# Patient Record
Sex: Female | Born: 1980 | Race: White | Hispanic: No | Marital: Single | Smoking: Current every day smoker
Health system: Southern US, Community
[De-identification: ages and names within clinical notes are randomized; demographics above are authoritative.]

## PROBLEM LIST (undated history)

## (undated) DIAGNOSIS — N83202 Unspecified ovarian cyst, left side: Secondary | ICD-10-CM

## (undated) DIAGNOSIS — N83201 Unspecified ovarian cyst, right side: Secondary | ICD-10-CM

## (undated) HISTORY — PX: APPENDECTOMY: SHX54

## (undated) HISTORY — PX: OOPHORECTOMY: SHX86

## (undated) HISTORY — PX: OVARIAN CYST REMOVAL: SHX89

---

## 2000-06-24 ENCOUNTER — Emergency Department (HOSPITAL_COMMUNITY): Admission: EM | Admit: 2000-06-24 | Discharge: 2000-06-25 | Payer: Self-pay | Admitting: Emergency Medicine

## 2003-09-01 ENCOUNTER — Other Ambulatory Visit: Payer: Self-pay

## 2003-12-27 ENCOUNTER — Emergency Department: Payer: Self-pay | Admitting: Emergency Medicine

## 2004-03-19 ENCOUNTER — Emergency Department: Payer: Self-pay | Admitting: Emergency Medicine

## 2004-08-11 ENCOUNTER — Emergency Department: Payer: Self-pay | Admitting: Emergency Medicine

## 2004-11-05 ENCOUNTER — Emergency Department: Payer: Self-pay | Admitting: Emergency Medicine

## 2005-07-01 ENCOUNTER — Emergency Department: Payer: Self-pay | Admitting: Emergency Medicine

## 2005-08-25 ENCOUNTER — Emergency Department: Payer: Self-pay | Admitting: Emergency Medicine

## 2005-12-02 ENCOUNTER — Emergency Department: Payer: Self-pay

## 2006-03-04 ENCOUNTER — Emergency Department: Payer: Self-pay | Admitting: Emergency Medicine

## 2006-04-03 ENCOUNTER — Emergency Department: Payer: Self-pay | Admitting: Emergency Medicine

## 2006-07-07 ENCOUNTER — Emergency Department: Payer: Self-pay | Admitting: Emergency Medicine

## 2006-08-01 ENCOUNTER — Emergency Department: Payer: Self-pay | Admitting: Emergency Medicine

## 2008-06-28 ENCOUNTER — Emergency Department: Payer: Self-pay | Admitting: Internal Medicine

## 2008-08-11 ENCOUNTER — Emergency Department: Payer: Self-pay | Admitting: Emergency Medicine

## 2008-08-14 ENCOUNTER — Emergency Department: Payer: Self-pay | Admitting: Emergency Medicine

## 2008-10-10 ENCOUNTER — Emergency Department: Payer: Self-pay | Admitting: Emergency Medicine

## 2008-12-21 ENCOUNTER — Emergency Department: Payer: Self-pay | Admitting: Emergency Medicine

## 2009-04-19 ENCOUNTER — Emergency Department: Payer: Self-pay | Admitting: Unknown Physician Specialty

## 2009-06-05 ENCOUNTER — Emergency Department: Payer: Self-pay | Admitting: Emergency Medicine

## 2009-08-15 ENCOUNTER — Emergency Department: Payer: Self-pay | Admitting: Emergency Medicine

## 2009-08-23 ENCOUNTER — Emergency Department: Payer: Self-pay | Admitting: Emergency Medicine

## 2010-01-10 ENCOUNTER — Ambulatory Visit: Payer: Self-pay

## 2010-04-11 ENCOUNTER — Ambulatory Visit: Payer: Self-pay | Admitting: Family Medicine

## 2010-04-18 ENCOUNTER — Ambulatory Visit: Payer: Self-pay | Admitting: Family Medicine

## 2010-05-07 ENCOUNTER — Emergency Department: Payer: Self-pay | Admitting: Internal Medicine

## 2013-01-19 ENCOUNTER — Emergency Department: Payer: Self-pay | Admitting: Emergency Medicine

## 2013-01-19 LAB — URINALYSIS, COMPLETE
Bacteria: NONE SEEN
Bilirubin,UR: NEGATIVE
Blood: NEGATIVE
Glucose,UR: NEGATIVE mg/dL (ref 0–75)
Ketone: NEGATIVE
Nitrite: NEGATIVE
Protein: NEGATIVE
RBC,UR: <1 /HPF (ref 0–5)
Specific Gravity: 1.012 (ref 1.003–1.030)

## 2013-01-19 LAB — COMPREHENSIVE METABOLIC PANEL
Albumin: 3.5 g/dL (ref 3.4–5.0)
Anion Gap: 4 — ABNORMAL LOW (ref 7–16)
BUN: 9 mg/dL (ref 7–18)
Calcium, Total: 8.7 mg/dL (ref 8.5–10.1)
Chloride: 104 mmol/L (ref 98–107)
Creatinine: 0.8 mg/dL (ref 0.60–1.30)
EGFR (African American): >60
SGOT(AST): 15 U/L (ref 15–37)
Total Protein: 6.4 g/dL (ref 6.4–8.2)

## 2013-01-19 LAB — CBC WITH DIFFERENTIAL/PLATELET
Eosinophil #: 0.4 10*3/uL (ref 0.0–0.7)
Eosinophil %: 4.2 %
HCT: 35.9 % (ref 35.0–47.0)
HGB: 12.2 g/dL (ref 12.0–16.0)
Monocyte #: 0.5 x10 3/mm (ref 0.2–0.9)
Neutrophil %: 52.9 %
Platelet: 239 10*3/uL (ref 150–440)
RBC: 3.94 10*6/uL (ref 3.80–5.20)
WBC: 10.1 10*3/uL (ref 3.6–11.0)

## 2013-04-16 ENCOUNTER — Emergency Department: Payer: Self-pay | Admitting: Emergency Medicine

## 2013-04-16 LAB — URINALYSIS, COMPLETE
BACTERIA: NONE SEEN
Bilirubin,UR: NEGATIVE
Blood: NEGATIVE
GLUCOSE, UR: NEGATIVE mg/dL (ref 0–75)
KETONE: NEGATIVE
LEUKOCYTE ESTERASE: NEGATIVE
Nitrite: NEGATIVE
PH: 6 (ref 4.5–8.0)
Protein: NEGATIVE
SPECIFIC GRAVITY: 1.017 (ref 1.003–1.030)

## 2013-05-10 ENCOUNTER — Emergency Department: Payer: Self-pay | Admitting: Emergency Medicine

## 2013-06-15 ENCOUNTER — Emergency Department: Payer: Self-pay | Admitting: Emergency Medicine

## 2013-06-15 LAB — CBC WITH DIFFERENTIAL/PLATELET
BASOS ABS: 0.1 10*3/uL (ref 0.0–0.1)
Basophil %: 1.3 %
EOS PCT: 1.6 %
Eosinophil #: 0.2 10*3/uL (ref 0.0–0.7)
HCT: 38 % (ref 35.0–47.0)
HGB: 12.5 g/dL (ref 12.0–16.0)
Lymphocyte #: 3.2 10*3/uL (ref 1.0–3.6)
Lymphocyte %: 30.3 %
MCH: 30.9 pg (ref 26.0–34.0)
MCHC: 33 g/dL (ref 32.0–36.0)
MCV: 94 fL (ref 80–100)
MONOS PCT: 6.1 %
Monocyte #: 0.6 x10 3/mm (ref 0.2–0.9)
NEUTROS PCT: 60.7 %
Neutrophil #: 6.4 10*3/uL (ref 1.4–6.5)
PLATELETS: 269 10*3/uL (ref 150–440)
RBC: 4.05 10*6/uL (ref 3.80–5.20)
RDW: 13.5 % (ref 11.5–14.5)
WBC: 10.5 10*3/uL (ref 3.6–11.0)

## 2013-06-15 LAB — URINALYSIS, COMPLETE
BILIRUBIN, UR: NEGATIVE
Bacteria: NONE SEEN
Blood: NEGATIVE
Glucose,UR: NEGATIVE mg/dL (ref 0–75)
KETONE: NEGATIVE
Leukocyte Esterase: NEGATIVE
NITRITE: NEGATIVE
Ph: 7 (ref 4.5–8.0)
Protein: NEGATIVE
RBC,UR: <1 /HPF (ref 0–5)
Specific Gravity: 1.013 (ref 1.003–1.030)
Squamous Epithelial: <1
WBC UR: NONE SEEN /HPF (ref 0–5)

## 2013-06-15 LAB — BASIC METABOLIC PANEL
ANION GAP: 3 — AB (ref 7–16)
BUN: 16 mg/dL (ref 7–18)
CO2: 29 mmol/L (ref 21–32)
Calcium, Total: 8.8 mg/dL (ref 8.5–10.1)
Chloride: 104 mmol/L (ref 98–107)
Creatinine: 0.78 mg/dL (ref 0.60–1.30)
EGFR (Non-African Amer.): >60
GLUCOSE: 89 mg/dL (ref 65–99)
Osmolality: 273 (ref 275–301)
Potassium: 4.3 mmol/L (ref 3.5–5.1)
SODIUM: 136 mmol/L (ref 136–145)

## 2013-07-07 ENCOUNTER — Emergency Department: Payer: Self-pay | Admitting: Emergency Medicine

## 2013-08-31 ENCOUNTER — Emergency Department: Payer: Self-pay | Admitting: Emergency Medicine

## 2013-08-31 LAB — URINALYSIS, COMPLETE
Bilirubin,UR: NEGATIVE
Glucose,UR: NEGATIVE mg/dL (ref 0–75)
Ketone: NEGATIVE
NITRITE: POSITIVE
PH: 5 (ref 4.5–8.0)
RBC,UR: 23 /HPF (ref 0–5)
Specific Gravity: 1.016 (ref 1.003–1.030)
Squamous Epithelial: 4
WBC UR: 1002 /HPF (ref 0–5)

## 2013-10-13 ENCOUNTER — Emergency Department: Payer: Self-pay | Admitting: Emergency Medicine

## 2014-03-04 ENCOUNTER — Emergency Department: Payer: Self-pay | Admitting: Emergency Medicine

## 2014-03-04 LAB — CBC WITH DIFFERENTIAL/PLATELET
BASOS PCT: 1.6 %
Basophil #: 0.1 10*3/uL (ref 0.0–0.1)
EOS PCT: 11.2 %
Eosinophil #: 1 10*3/uL — ABNORMAL HIGH (ref 0.0–0.7)
HCT: 41.7 % (ref 35.0–47.0)
HGB: 14 g/dL (ref 12.0–16.0)
LYMPHS ABS: 3.1 10*3/uL (ref 1.0–3.6)
Lymphocyte %: 34.8 %
MCH: 31.4 pg (ref 26.0–34.0)
MCHC: 33.5 g/dL (ref 32.0–36.0)
MCV: 94 fL (ref 80–100)
MONOS PCT: 6.4 %
Monocyte #: 0.6 x10 3/mm (ref 0.2–0.9)
NEUTROS ABS: 4.1 10*3/uL (ref 1.4–6.5)
Neutrophil %: 46 %
Platelet: 232 10*3/uL (ref 150–440)
RBC: 4.46 10*6/uL (ref 3.80–5.20)
RDW: 12.5 % (ref 11.5–14.5)
WBC: 9 10*3/uL (ref 3.6–11.0)

## 2014-03-04 LAB — COMPREHENSIVE METABOLIC PANEL
ALT: 12 U/L — AB
Albumin: 3.4 g/dL (ref 3.4–5.0)
Alkaline Phosphatase: 39 U/L — ABNORMAL LOW
Anion Gap: 5 — ABNORMAL LOW (ref 7–16)
BILIRUBIN TOTAL: 0.3 mg/dL (ref 0.2–1.0)
BUN: 12 mg/dL (ref 7–18)
CREATININE: 0.88 mg/dL (ref 0.60–1.30)
Calcium, Total: 8.7 mg/dL (ref 8.5–10.1)
Chloride: 109 mmol/L — ABNORMAL HIGH (ref 98–107)
Co2: 25 mmol/L (ref 21–32)
EGFR (African American): >60
EGFR (Non-African Amer.): >60
Glucose: 107 mg/dL — ABNORMAL HIGH (ref 65–99)
OSMOLALITY: 278 (ref 275–301)
Potassium: 4.4 mmol/L (ref 3.5–5.1)
SGOT(AST): 21 U/L (ref 15–37)
Sodium: 139 mmol/L (ref 136–145)
Total Protein: 6.7 g/dL (ref 6.4–8.2)

## 2014-03-04 LAB — URINALYSIS, COMPLETE
BACTERIA: NONE SEEN
BILIRUBIN, UR: NEGATIVE
BLOOD: NEGATIVE
Glucose,UR: NEGATIVE mg/dL (ref 0–75)
Ketone: NEGATIVE
Leukocyte Esterase: NEGATIVE
NITRITE: NEGATIVE
Ph: 6 (ref 4.5–8.0)
Protein: NEGATIVE
RBC,UR: NONE SEEN /HPF (ref 0–5)
Specific Gravity: 1.015 (ref 1.003–1.030)
Squamous Epithelial: 4
WBC UR: <1 /HPF (ref 0–5)

## 2014-06-13 ENCOUNTER — Emergency Department
Admit: 2014-06-13 | Disposition: A | Discharge status: Home / Self Care | Payer: Self-pay | Admitting: Emergency Medicine

## 2014-06-13 LAB — BASIC METABOLIC PANEL
Anion Gap: 5 — ABNORMAL LOW (ref 7–16)
BUN: 16 mg/dL
CHLORIDE: 105 mmol/L
CO2: 29 mmol/L
CREATININE: 0.82 mg/dL
Calcium, Total: 8.9 mg/dL
EGFR (African American): >60
EGFR (Non-African Amer.): >60
Glucose: 79 mg/dL
Potassium: 3.9 mmol/L
SODIUM: 139 mmol/L

## 2014-06-13 LAB — CBC WITH DIFFERENTIAL/PLATELET
Basophil #: 0.1 10*3/uL (ref 0.0–0.1)
Basophil %: 1 %
Eosinophil #: 0.4 10*3/uL (ref 0.0–0.7)
Eosinophil %: 4.3 %
HCT: 39 % (ref 35.0–47.0)
HGB: 13 g/dL (ref 12.0–16.0)
Lymphocyte #: 3.8 10*3/uL — ABNORMAL HIGH (ref 1.0–3.6)
Lymphocyte %: 40.1 %
MCH: 30.9 pg (ref 26.0–34.0)
MCHC: 33.3 g/dL (ref 32.0–36.0)
MCV: 93 fL (ref 80–100)
MONOS PCT: 6.4 %
Monocyte #: 0.6 x10 3/mm (ref 0.2–0.9)
Neutrophil #: 4.5 10*3/uL (ref 1.4–6.5)
Neutrophil %: 48.2 %
PLATELETS: 258 10*3/uL (ref 150–440)
RBC: 4.2 10*6/uL (ref 3.80–5.20)
RDW: 13.3 % (ref 11.5–14.5)
WBC: 9.4 10*3/uL (ref 3.6–11.0)

## 2014-06-13 LAB — URINALYSIS, COMPLETE
BACTERIA: NONE SEEN
BILIRUBIN, UR: NEGATIVE
Blood: NEGATIVE
Glucose,UR: NEGATIVE mg/dL (ref 0–75)
KETONE: NEGATIVE
Leukocyte Esterase: NEGATIVE
Nitrite: NEGATIVE
PROTEIN: NEGATIVE
Ph: 5 (ref 4.5–8.0)
Specific Gravity: 1.017 (ref 1.003–1.030)

## 2014-07-22 ENCOUNTER — Encounter: Payer: Self-pay | Admitting: Emergency Medicine

## 2014-07-22 ENCOUNTER — Emergency Department: Payer: Self-pay

## 2014-07-22 ENCOUNTER — Emergency Department
Admission: EM | Admit: 2014-07-22 | Discharge: 2014-07-22 | Disposition: A | Discharge status: Home / Self Care | Payer: Self-pay | Attending: Emergency Medicine | Admitting: Emergency Medicine

## 2014-07-22 DIAGNOSIS — Z720 Tobacco use: Secondary | ICD-10-CM | POA: Insufficient documentation

## 2014-07-22 DIAGNOSIS — Y9289 Other specified places as the place of occurrence of the external cause: Secondary | ICD-10-CM | POA: Insufficient documentation

## 2014-07-22 DIAGNOSIS — Y998 Other external cause status: Secondary | ICD-10-CM | POA: Insufficient documentation

## 2014-07-22 DIAGNOSIS — X58XXXA Exposure to other specified factors, initial encounter: Secondary | ICD-10-CM | POA: Insufficient documentation

## 2014-07-22 DIAGNOSIS — S39012A Strain of muscle, fascia and tendon of lower back, initial encounter: Secondary | ICD-10-CM | POA: Insufficient documentation

## 2014-07-22 DIAGNOSIS — Y9389 Activity, other specified: Secondary | ICD-10-CM | POA: Insufficient documentation

## 2014-07-22 DIAGNOSIS — Z79899 Other long term (current) drug therapy: Secondary | ICD-10-CM | POA: Insufficient documentation

## 2014-07-22 MED ORDER — DIAZEPAM 2 MG PO TABS
ORAL_TABLET | ORAL | Status: AC
  Administered 2014-07-22: 2 mg via ORAL
  Filled 2014-07-22: qty 1

## 2014-07-22 MED ORDER — OXYCODONE-ACETAMINOPHEN 5-325 MG PO TABS: 1.0000 | ORAL_TABLET | ORAL | Status: DC | PRN

## 2014-07-22 MED ORDER — OXYCODONE-ACETAMINOPHEN 5-325 MG PO TABS
ORAL_TABLET | ORAL | Status: AC
  Filled 2014-07-22: qty 1

## 2014-07-22 MED ORDER — KETOROLAC TROMETHAMINE 60 MG/2ML IM SOLN
60.0000 mg | Freq: Once | INTRAMUSCULAR | Status: AC
  Administered 2014-07-22: 60 mg via INTRAMUSCULAR

## 2014-07-22 MED ORDER — ETODOLAC 400 MG PO TABS: 400.0000 mg | ORAL_TABLET | Freq: Two times a day (BID) | ORAL | Status: DC

## 2014-07-22 MED ORDER — DIAZEPAM 2 MG PO TABS: 2.0000 mg | ORAL_TABLET | Freq: Three times a day (TID) | ORAL | Status: DC | PRN

## 2014-07-22 MED ORDER — KETOROLAC TROMETHAMINE 60 MG/2ML IM SOLN
INTRAMUSCULAR | Status: AC
  Administered 2014-07-22: 60 mg via INTRAMUSCULAR
  Filled 2014-07-22: qty 2

## 2014-07-22 MED ORDER — DIAZEPAM 2 MG PO TABS
2.0000 mg | ORAL_TABLET | Freq: Once | ORAL | Status: AC
  Administered 2014-07-22: 2 mg via ORAL

## 2014-07-22 MED ORDER — OXYCODONE-ACETAMINOPHEN 5-325 MG PO TABS
1.0000 | ORAL_TABLET | Freq: Once | ORAL | Status: AC
  Administered 2014-07-22: 1 via ORAL

## 2014-07-22 NOTE — ED Provider Notes (Signed)
[facility] Emergency Department Provider Note  ____________________________________________  Time seen: Approximately 9:30 AM  I have reviewed the triage vital signs and the nursing notes.   HISTORY  Chief Complaint Back Pain  HPI Jennifer Dennis is a 34 y.o. female comes in today with complaint of low back pain.She states that approximately 2 weeks ago she was lifting some sheet rock and has had pain since. She is tried over-the-counter medications without any relief. She denies any loss of bowel or bladder function. Currently her pain is rated 7 out of 10. There is no radiation of pain down into the legs, she is able to ambulate slowly. She denies any urinary symptoms. There is no recent back injury or surgeries prior to this.   History reviewed. No pertinent past medical history.  There are no active problems to display for this patient.   History reviewed. No pertinent past surgical history.  Current Outpatient Rx  Name  Route  Sig  Dispense  Refill  . diazepam (VALIUM) 2 MG tablet   Oral   Take 1 tablet (2 mg total) by mouth every 8 (eight) hours as needed for muscle spasms.   9 tablet   0   . etodolac (LODINE) 400 MG tablet   Oral   Take 1 tablet (400 mg total) by mouth 2 (two) times daily.   20 tablet   0   . oxyCODONE-acetaminophen (PERCOCET) 5-325 MG per tablet   Oral   Take 1 tablet by mouth every 4 (four) hours as needed for severe pain.   20 tablet   0     Allergies Sulfur  No family history on file.  Social History History  Substance Use Topics  . Smoking status: Current Every Day Smoker  . Smokeless tobacco: Not on file  . Alcohol Use: No    Review of Systems Constitutional: No fever/chills Eyes: No visual changes. ENT: No sore throat. Cardiovascular: Denies chest pain. Respiratory: Denies shortness of breath. Gastrointestinal: No abdominal pain.  No nausea, no vomiting.  Genitourinary: Negative for  dysuria. Musculoskeletal: Positive for back pain. Skin: Negative for rash. Neurological: Negative for headaches, focal weakness or numbness.  10-point ROS otherwise negative.  ____________________________________________   PHYSICAL EXAM:  VITAL SIGNS: ED Triage Vitals  Enc Vitals Group     BP 07/22/14 0911 99/69 mmHg     Pulse Rate 07/22/14 0911 71     Resp --      Temp 07/22/14 0911 98.2 F (36.8 C)     Temp Source 07/22/14 0911 Oral     SpO2 07/22/14 0911 99 %     Weight 07/22/14 0911 120 lb (54.432 kg)     Height 07/22/14 0911 5' (1.524 m)     Head Cir --      Peak Flow --      Pain Score 07/22/14 0909 7     Pain Loc --      Pain Edu? --      Excl. in GC? --     Constitutional: Alert and oriented. Well appearing and in no acute distress. Eyes: Conjunctivae are normal. PERRL. EOMI. Head: Atraumatic. Nose: No congestion/rhinnorhea. Mouth/Throat: Mucous membranes are moist. Neck: No stridor.   Cardiovascular: Normal rate, regular rhythm. Grossly normal heart sounds.  Good peripheral circulation. Respiratory: Normal respiratory effort.  No retractions. Lungs CTAB. Gastrointestinal: Soft and nontender. No distention. No abdominal bruits. No CVA tenderness. Musculoskeletal: No lower extremity tenderness nor edema.  No joint effusions.  There is moderate tenderness on palpation of low back L5-S1 area and paravertebral muscles. Range of motion is restricted secondary to discomfort. Straight leg raise is approximately 90 with discomfort. No gross deformity was noted. Neurologic:  Normal speech and language. No gross focal neurologic deficits are appreciated. Speech is normal. No gait instability. Reflexes are 2+ bilaterally. Skin:  Skin is warm, dry and intact. No rash noted. Psychiatric: Mood and affect are normal. Speech and behavior are normal.  ____________________________________________   LABS (all labs ordered are listed, but only abnormal results are  displayed)  Labs Reviewed - No data to display RADIOLOGY  Mild dextroscoliosis was noted on x-ray with minimal disc space flattening at L5-S1 per radiologist and reviewed by me. ____________________________________________   PROCEDURES  Procedure(s) performed: None  Critical Care performed: No  ____________________________________________   INITIAL IMPRESSION / ASSESSMENT AND PLAN / ED COURSE  Pertinent labs & imaging results that were available during my care of the patient were reviewed by me and considered in my medical decision making (see chart for details).  Patient is to follow-up with orthopedist if not improving. She was placed on anti-inflammatory, pain medication and a muscle relaxant. She states she does not work this weekend. She is to return to the emergency room today severe worsening or urgent concerns. ____________________________________________   FINAL CLINICAL IMPRESSION(S) / ED DIAGNOSES  Final diagnoses:  Low back strain, initial encounter      Tommi RumpsRhonda L Asahd Can, PA-C 07/22/14 1119  Governor Rooksebecca Lord, MD 07/22/14 725 [phone]

## 2014-07-22 NOTE — ED Notes (Signed)
Back from xray  States no relief with pain meds   PA aware

## 2015-01-09 ENCOUNTER — Encounter: Payer: Self-pay | Admitting: Emergency Medicine

## 2015-01-09 ENCOUNTER — Emergency Department
Admission: EM | Admit: 2015-01-09 | Discharge: 2015-01-09 | Discharge status: Against Medical Advice | Payer: Self-pay | Attending: Emergency Medicine | Admitting: Emergency Medicine

## 2015-01-09 DIAGNOSIS — Y93F2 Activity, caregiving, lifting: Secondary | ICD-10-CM | POA: Insufficient documentation

## 2015-01-09 DIAGNOSIS — Z720 Tobacco use: Secondary | ICD-10-CM | POA: Insufficient documentation

## 2015-01-09 DIAGNOSIS — Y9289 Other specified places as the place of occurrence of the external cause: Secondary | ICD-10-CM | POA: Insufficient documentation

## 2015-01-09 DIAGNOSIS — S3992XA Unspecified injury of lower back, initial encounter: Secondary | ICD-10-CM | POA: Insufficient documentation

## 2015-01-09 DIAGNOSIS — X501XXA Overexertion from prolonged static or awkward postures, initial encounter: Secondary | ICD-10-CM | POA: Insufficient documentation

## 2015-01-09 DIAGNOSIS — Y990 Civilian activity done for income or pay: Secondary | ICD-10-CM | POA: Insufficient documentation

## 2015-01-09 NOTE — ED Notes (Signed)
Pt to ed with c/o lifting something heavy at work today and since then having lower back pain.  Pt reports this is NOT workers comp.

## 2015-01-09 NOTE — ED Notes (Signed)
Pt not in room for PA exam

## 2015-01-09 NOTE — ED Notes (Signed)
Pt not in room for exam

## 2015-03-01 ENCOUNTER — Emergency Department
Admission: EM | Admit: 2015-03-01 | Discharge: 2015-03-01 | Disposition: A | Discharge status: Home / Self Care | Payer: Self-pay | Attending: Emergency Medicine | Admitting: Emergency Medicine

## 2015-03-01 DIAGNOSIS — Z3202 Encounter for pregnancy test, result negative: Secondary | ICD-10-CM | POA: Insufficient documentation

## 2015-03-01 DIAGNOSIS — Z79899 Other long term (current) drug therapy: Secondary | ICD-10-CM | POA: Insufficient documentation

## 2015-03-01 DIAGNOSIS — R1031 Right lower quadrant pain: Secondary | ICD-10-CM | POA: Insufficient documentation

## 2015-03-01 DIAGNOSIS — F17200 Nicotine dependence, unspecified, uncomplicated: Secondary | ICD-10-CM | POA: Insufficient documentation

## 2015-03-01 DIAGNOSIS — N83201 Unspecified ovarian cyst, right side: Secondary | ICD-10-CM | POA: Insufficient documentation

## 2015-03-01 LAB — URINALYSIS COMPLETE WITH MICROSCOPIC (ARMC ONLY)
BILIRUBIN URINE: NEGATIVE
Bacteria, UA: NONE SEEN
GLUCOSE, UA: NEGATIVE mg/dL
Ketones, ur: NEGATIVE mg/dL
LEUKOCYTES UA: NEGATIVE
Nitrite: NEGATIVE
Protein, ur: NEGATIVE mg/dL
Specific Gravity, Urine: 1.021 (ref 1.005–1.030)
pH: 6 (ref 5.0–8.0)

## 2015-03-01 LAB — COMPREHENSIVE METABOLIC PANEL
ALT: 9 U/L — ABNORMAL LOW (ref 14–54)
AST: 15 U/L (ref 15–41)
Albumin: 4.1 g/dL (ref 3.5–5.0)
Alkaline Phosphatase: 31 U/L — ABNORMAL LOW (ref 38–126)
Anion gap: 8 (ref 5–15)
BUN: 14 mg/dL (ref 6–20)
CALCIUM: 9.2 mg/dL (ref 8.9–10.3)
CO2: 22 mmol/L (ref 22–32)
CREATININE: 0.76 mg/dL (ref 0.44–1.00)
Chloride: 107 mmol/L (ref 101–111)
Glucose, Bld: 121 mg/dL — ABNORMAL HIGH (ref 65–99)
Potassium: 4.1 mmol/L (ref 3.5–5.1)
Sodium: 137 mmol/L (ref 135–145)
Total Bilirubin: 0.9 mg/dL (ref 0.3–1.2)
Total Protein: 7.1 g/dL (ref 6.5–8.1)

## 2015-03-01 LAB — CBC
HCT: 39 % (ref 35.0–47.0)
Hemoglobin: 13.2 g/dL (ref 12.0–16.0)
MCH: 30.3 pg (ref 26.0–34.0)
MCHC: 33.8 g/dL (ref 32.0–36.0)
MCV: 89.7 fL (ref 80.0–100.0)
PLATELETS: 263 10*3/uL (ref 150–440)
RBC: 4.35 MIL/uL (ref 3.80–5.20)
RDW: 12.8 % (ref 11.5–14.5)
WBC: 8 10*3/uL (ref 3.6–11.0)

## 2015-03-01 LAB — LIPASE, BLOOD: Lipase: 39 U/L (ref 11–51)

## 2015-03-01 LAB — POCT PREGNANCY, URINE: PREG TEST UR: NEGATIVE

## 2015-03-01 MED ORDER — HYDROCODONE-ACETAMINOPHEN 5-325 MG PO TABS: 1.0000 | ORAL_TABLET | Freq: Four times a day (QID) | ORAL | Status: DC | PRN

## 2015-03-01 NOTE — ED Notes (Signed)
Pt discharged home after verbalizing understanding of discharge instructions; nad noted. 

## 2015-03-01 NOTE — ED Provider Notes (Signed)
Time Seen: Approximately 1420  I have reviewed the triage notes  Chief Complaint: Abdominal Pain   History of Present Illness: Jennifer Dennis is a 34 y.o. female who states some right lower quadrant abdominal pain. 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 Patient states that she has a follow-up appointment scheduled but is not available until the end of January. She's had pain down the right lower quadrant for the last 2 days without any associated fever she states the pain seems to come and go home and said denies any further vaginal discharge etc. Pain intensity, characteristics, location is similar to her previous ovarian cysts   History reviewed. No pertinent past medical history.  There are no active problems to display for this patient.   History reviewed. No pertinent past surgical history.  History reviewed. No pertinent past surgical history.  Current Outpatient Rx  Name  Route  Sig  Dispense  Refill  . diazepam (VALIUM) 2 MG tablet   Oral   Take 1 tablet (2 mg total) by mouth every 8 (eight) hours as needed for muscle spasms.   9 tablet   0   . etodolac (LODINE) 400 MG tablet   Oral   Take 1 tablet (400 mg total) by mouth 2 (two) times daily.   20 tablet   0   . HYDROcodone-acetaminophen (NORCO) 5-325 MG tablet   Oral   Take 1 tablet by mouth every 6 (six) hours as needed for moderate pain.   20 tablet   0   . oxyCODONE-acetaminophen (PERCOCET) 5-325 MG per tablet   Oral   Take 1 tablet by mouth every 4 (four) hours as needed for severe pain.   20 tablet   0     Allergies:  Sulfur  Family History: No family history on file.  Social History: Social History  Substance Use Topics  . Smoking status: Current Every Day Smoker  . Smokeless  tobacco: None  . Alcohol Use: Yes     Review of Systems:   10 point review of systems was performed and was otherwise negative:  Constitutional: No fever Eyes: No visual disturbances ENT: No sore throat, ear pain Cardiac: No chest pain Respiratory: No shortness of breath, wheezing, or stridor Abdomen: Exclusively right lower quadrant, no vomiting, No diarrhea Endocrine: No weight loss, No night sweats Extremities: No peripheral edema, cyanosis Skin: No rashes, easy bruising Neurologic: No focal weakness, trouble with speech or swollowing Urologic: No dysuria, Hematuria, or urinary frequency   Physical Exam:  ED Triage Vitals  Enc Vitals Group     BP 03/01/15 1103 134/111 mmHg     Pulse Rate 03/01/15 1103 96     Resp 03/01/15 1103 18     Temp 03/01/15 1103 98.1 F (36.7 C)     Temp Source 03/01/15 1103 Oral     SpO2 03/01/15 1103 98 %     Weight 03/01/15 1103 125 lb (56.7 kg)     Height 03/01/15 1103 5' (1.524 m)     Head Cir --      Peak Flow --      Pain Score 03/01/15 1103 9     Pain Loc --      Pain Edu? --      Excl. in GC? --  General: Awake , Alert , and Oriented times 3; GCS 15 Head: Normal cephalic , atraumatic Eyes: Pupils equal , round, reactive to light Nose/Throat: No nasal drainage, patent upper airway without erythema or exudate.  Neck: Supple, Full range of motion, No anterior adenopathy or palpable thyroid masses Lungs: Clear to ascultation without wheezes , rhonchi, or rales Heart: Regular rate, regular rhythm without murmurs , gallops , or rubs Abdomen: Mild tenderness in right lower quadrant without palpable masses and without rebound, guarding , or rigidity; bowel sounds positive and symmetric in all 4 quadrants. No organomegaly .        Extremities: 2 plus symmetric pulses. No edema, clubbing or cyanosis Neurologic: normal ambulation, Motor symmetric without deficits, sensory intact Skin: warm, dry, no rashes   Labs:   All laboratory  work was reviewed including any pertinent negatives or positives listed below:  Labs Reviewed  COMPREHENSIVE METABOLIC PANEL - Abnormal; Notable for the following:    Glucose, Bld 121 (*)    ALT 9 (*)    Alkaline Phosphatase 31 (*)    All other components within normal limits  URINALYSIS COMPLETEWITH MICROSCOPIC ([facility] ONLY) - Abnormal; Notable for the following:    Color, Urine YELLOW (*)    APPearance CLEAR (*)    Hgb urine dipstick 1+ (*)    Squamous Epithelial / LPF 0-5 (*)    All other components within normal limits  LIPASE, BLOOD  CBC  POC URINE PREG, ED  POCT PREGNANCY, URINE     ED Course: * Differential was limited based on the patient's previous past medical and surgical history. I felt this was unlikely to be ovarian torsion and felt further imaging was not necessary. Patient most likely has a recurrent ovarian cyst. She has been taking ibuprofen over-the-counter for pain she was advised continue with that medication was prescribed Norco for breakthrough pain. Follow-up appointment as scheduled with Dr. Crista Curb OB/GYN.    Assessment:  Right-sided ovarian cyst   Final Clinical Impression:   Final diagnoses:  Right lower quadrant abdominal pain     Plan:  Outpatient management Patient was advised to return immediately if condition worsens. Patient was advised to follow up with their primary care physician or other specialized physicians involved in their outpatient care            Jennye Moccasin, MD 03/01/15 1454

## 2015-03-01 NOTE — ED Notes (Signed)
Pt arrives to ER via POV c/o RLQ pain. Pt states that she has a cyst on her ovary to right side. Pt thinks that it may have burst. Pain X 2 nights, causing pt to vomit due to pain. Pt alert and oriented X4, active, cooperative, pt in NAD. RR even and unlabored, color WNL.

## 2015-03-01 NOTE — Discharge Instructions (Signed)
Abdominal Pain, Adult °Many things can cause abdominal pain. Usually, abdominal pain is not caused by a disease and will improve without treatment. It can often be observed and treated at home. Your health care provider will do a physical exam and possibly order blood tests and X-rays to help determine the seriousness of your pain. However, in many cases, more time must pass before a clear cause of the pain can be found. Before that point, your health care provider may not know if you need more testing or further treatment. °HOME CARE INSTRUCTIONS °Monitor your abdominal pain for any changes. The following actions may help to alleviate any discomfort you are experiencing: °· Only take over-the-counter or prescription medicines as directed by your health care provider. °· Do not take laxatives unless directed to do so by your health care provider. °· Try a clear liquid diet (broth, tea, or water) as directed by your health care provider. Slowly move to a bland diet as tolerated. °SEEK MEDICAL CARE IF: °· You have unexplained abdominal pain. °· You have abdominal pain associated with nausea or diarrhea. °· You have pain when you urinate or have a bowel movement. °· You experience abdominal pain that wakes you in the night. °· You have abdominal pain that is worsened or improved by eating food. °· You have abdominal pain that is worsened with eating fatty foods. °· You have a fever. °SEEK IMMEDIATE MEDICAL CARE IF: °· Your pain does not go away within 2 hours. °· You keep throwing up (vomiting). °· Your pain is felt only in portions of the abdomen, such as the right side or the left lower portion of the abdomen. °· You pass bloody or black tarry stools. °MAKE SURE YOU: °· Understand these instructions. °· Will watch your condition. °· Will get help right away if you are not doing well or get worse. °  °This information is not intended to replace advice given to you by your health care provider. Make sure you discuss  any questions you have with your health care provider. °  °Document Released: 11/28/2004 Document Revised: 11/09/2014 Document Reviewed: 10/28/2012 °Elsevier Interactive Patient Education ©2016 [employer] ° ° °Please return immediately if condition worsens. Please contact her primary physician or the physician you were given for referral. If you have any specialist physicians involved in her treatment and plan please also contact them. Thank you for using [facility]. ° °

## 2015-03-01 NOTE — ED Notes (Signed)
Pt from home with possible cyst rupture. Pt reports RLQ pain x 2 days.

## 2015-04-27 ENCOUNTER — Emergency Department
Admission: EM | Admit: 2015-04-27 | Discharge: 2015-04-27 | Disposition: A | Discharge status: Home / Self Care | Payer: Self-pay | Attending: Emergency Medicine | Admitting: Emergency Medicine

## 2015-04-27 ENCOUNTER — Emergency Department: Payer: Self-pay

## 2015-04-27 ENCOUNTER — Encounter: Payer: Self-pay | Admitting: Emergency Medicine

## 2015-04-27 DIAGNOSIS — Z3202 Encounter for pregnancy test, result negative: Secondary | ICD-10-CM | POA: Insufficient documentation

## 2015-04-27 DIAGNOSIS — R1030 Lower abdominal pain, unspecified: Secondary | ICD-10-CM | POA: Insufficient documentation

## 2015-04-27 DIAGNOSIS — F17200 Nicotine dependence, unspecified, uncomplicated: Secondary | ICD-10-CM | POA: Insufficient documentation

## 2015-04-27 DIAGNOSIS — R102 Pelvic and perineal pain: Secondary | ICD-10-CM | POA: Insufficient documentation

## 2015-04-27 DIAGNOSIS — G8929 Other chronic pain: Secondary | ICD-10-CM | POA: Insufficient documentation

## 2015-04-27 LAB — CBC
HEMATOCRIT: 37.6 % (ref 35.0–47.0)
HEMOGLOBIN: 12.7 g/dL (ref 12.0–16.0)
MCH: 31 pg (ref 26.0–34.0)
MCHC: 33.9 g/dL (ref 32.0–36.0)
MCV: 91.4 fL (ref 80.0–100.0)
Platelets: 254 10*3/uL (ref 150–440)
RBC: 4.11 MIL/uL (ref 3.80–5.20)
RDW: 13.3 % (ref 11.5–14.5)
WBC: 8.6 10*3/uL (ref 3.6–11.0)

## 2015-04-27 LAB — COMPREHENSIVE METABOLIC PANEL
ALBUMIN: 4.1 g/dL (ref 3.5–5.0)
ALT: 10 U/L — ABNORMAL LOW (ref 14–54)
ANION GAP: 6 (ref 5–15)
AST: 15 U/L (ref 15–41)
Alkaline Phosphatase: 30 U/L — ABNORMAL LOW (ref 38–126)
BUN: 15 mg/dL (ref 6–20)
CHLORIDE: 107 mmol/L (ref 101–111)
CO2: 26 mmol/L (ref 22–32)
Calcium: 9.5 mg/dL (ref 8.9–10.3)
Creatinine, Ser: 1.05 mg/dL — ABNORMAL HIGH (ref 0.44–1.00)
GFR calc Af Amer: >60 mL/min (ref >60)
GFR calc non Af Amer: >60 mL/min (ref >60)
GLUCOSE: 94 mg/dL (ref 65–99)
POTASSIUM: 4.8 mmol/L (ref 3.5–5.1)
Sodium: 139 mmol/L (ref 135–145)
TOTAL PROTEIN: 7 g/dL (ref 6.5–8.1)
Total Bilirubin: 0.5 mg/dL (ref 0.3–1.2)

## 2015-04-27 LAB — URINALYSIS COMPLETE WITH MICROSCOPIC (ARMC ONLY)
Bacteria, UA: NONE SEEN
Bilirubin Urine: NEGATIVE
Glucose, UA: NEGATIVE mg/dL
Hgb urine dipstick: NEGATIVE
Ketones, ur: NEGATIVE mg/dL
Leukocytes, UA: NEGATIVE
NITRITE: NEGATIVE
PROTEIN: NEGATIVE mg/dL
SPECIFIC GRAVITY, URINE: 1.004 — AB (ref 1.005–1.030)
pH: 7 (ref 5.0–8.0)

## 2015-04-27 LAB — LIPASE, BLOOD: LIPASE: 38 U/L (ref 11–51)

## 2015-04-27 LAB — POCT PREGNANCY, URINE: PREG TEST UR: NEGATIVE

## 2015-04-27 MED ORDER — OXYCODONE-ACETAMINOPHEN 5-325 MG PO TABS
ORAL_TABLET | ORAL | Status: AC
  Filled 2015-04-27: qty 1

## 2015-04-27 MED ORDER — OXYCODONE-ACETAMINOPHEN 5-325 MG PO TABS
ORAL_TABLET | ORAL | Status: AC
  Administered 2015-04-27: 2 via ORAL
  Filled 2015-04-27: qty 1

## 2015-04-27 MED ORDER — OXYCODONE-ACETAMINOPHEN 5-325 MG PO TABS
2.0000 | ORAL_TABLET | Freq: Once | ORAL | Status: AC
  Administered 2015-04-27: 2 via ORAL

## 2015-04-27 MED ORDER — IBUPROFEN 800 MG PO TABS
800.0000 mg | ORAL_TABLET | Freq: Once | ORAL | Status: AC
  Administered 2015-04-27: 800 mg via ORAL

## 2015-04-27 MED ORDER — IBUPROFEN 800 MG PO TABS
ORAL_TABLET | ORAL | Status: AC
  Administered 2015-04-27: 800 mg via ORAL
  Filled 2015-04-27: qty 1

## 2015-04-27 MED ORDER — IBUPROFEN 800 MG PO TABS: 800.0000 mg | ORAL_TABLET | Freq: Three times a day (TID) | ORAL | Status: DC | PRN
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
## 2015-04-27 NOTE — ED Notes (Signed)
Pt. States her friend picking her up in waiting room.
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
## 2015-04-27 NOTE — Discharge Instructions (Signed)

## 2015-07-04 ENCOUNTER — Encounter: Payer: Self-pay | Admitting: Emergency Medicine

## 2015-07-04 ENCOUNTER — Emergency Department
Admission: EM | Admit: 2015-07-04 | Discharge: 2015-07-05 | Disposition: A | Discharge status: Home / Self Care | Payer: Self-pay | Attending: Emergency Medicine | Admitting: Emergency Medicine

## 2015-07-04 DIAGNOSIS — Y929 Unspecified place or not applicable: Secondary | ICD-10-CM | POA: Insufficient documentation

## 2015-07-04 DIAGNOSIS — S86001A Unspecified injury of right Achilles tendon, initial encounter: Secondary | ICD-10-CM | POA: Insufficient documentation

## 2015-07-04 DIAGNOSIS — S93401A Sprain of unspecified ligament of right ankle, initial encounter: Secondary | ICD-10-CM | POA: Insufficient documentation

## 2015-07-04 DIAGNOSIS — Y999 Unspecified external cause status: Secondary | ICD-10-CM | POA: Insufficient documentation

## 2015-07-04 DIAGNOSIS — Y939 Activity, unspecified: Secondary | ICD-10-CM | POA: Insufficient documentation

## 2015-07-04 DIAGNOSIS — Z791 Long term (current) use of non-steroidal anti-inflammatories (NSAID): Secondary | ICD-10-CM | POA: Insufficient documentation

## 2015-07-04 DIAGNOSIS — X501XXA Overexertion from prolonged static or awkward postures, initial encounter: Secondary | ICD-10-CM | POA: Insufficient documentation

## 2015-07-04 DIAGNOSIS — F17210 Nicotine dependence, cigarettes, uncomplicated: Secondary | ICD-10-CM | POA: Insufficient documentation

## 2015-07-04 NOTE — ED Notes (Signed)
Pt presents to ED with right foot, ankle, and lower leg pain. Pt states she twisted her ankle last week and was seen at [facility] yesterday and told she tore ligaments in her ankle. air cast placed prior to discharge. Pt states today she had a sudden onset of severe pain radiating up to the back of her knee with slight increase in swelling. Pt tearful during triage. Air-cast loosened slightly prior to arrival and again in triage.

## 2015-07-05 MED ORDER — OXYCODONE-ACETAMINOPHEN 5-325 MG PO TABS: 1.0000 | ORAL_TABLET | ORAL | Status: DC | PRN

## 2015-07-05 MED ORDER — KETOROLAC TROMETHAMINE 60 MG/2ML IM SOLN
60.0000 mg | Freq: Once | INTRAMUSCULAR | Status: AC
  Administered 2015-07-05: 60 mg via INTRAMUSCULAR
  Filled 2015-07-05: qty 2

## 2015-07-05 MED ORDER — OXYCODONE-ACETAMINOPHEN 5-325 MG PO TABS
2.0000 | ORAL_TABLET | Freq: Once | ORAL | Status: AC
  Administered 2015-07-05: 2 via ORAL
  Filled 2015-07-05: qty 2

## 2015-07-05 NOTE — ED Notes (Signed)
ED techs at bedside applying splint

## 2015-07-05 NOTE — Discharge Instructions (Signed)
Ankle Sprain °An ankle sprain is an injury to the strong, fibrous tissues (ligaments) that hold the bones of your ankle joint together.  °CAUSES °An ankle sprain is usually caused by a fall or by twisting your ankle. Ankle sprains most commonly occur when you step on the outer edge of your foot, and your ankle turns inward. People who participate in sports are more prone to these types of injuries.  °SYMPTOMS  °· Pain in your ankle. The pain may be present at rest or only when you are trying to stand or walk. °· Swelling. °· Bruising. Bruising may develop immediately or within 1 to 2 days after your injury. °· Difficulty standing or walking, particularly when turning corners or changing directions. °DIAGNOSIS  °Your caregiver will ask you details about your injury and perform a physical exam of your ankle to determine if you have an ankle sprain. During the physical exam, your caregiver will press on and apply pressure to specific areas of your foot and ankle. Your caregiver will try to move your ankle in certain ways. An X-ray exam may be done to be sure a bone was not broken or a ligament did not separate from one of the bones in your ankle (avulsion fracture).  °TREATMENT  °Certain types of braces can help stabilize your ankle. Your caregiver can make a recommendation for this. Your caregiver may recommend the use of medicine for pain. If your sprain is severe, your caregiver may refer you to a surgeon who helps to restore function to parts of your skeletal system (orthopedist) or a physical therapist. °HOME CARE INSTRUCTIONS  °· Apply ice to your injury for 1-2 days or as directed by your caregiver. Applying ice helps to reduce inflammation and pain. °¨ Put ice in a plastic bag. °¨ Place a towel between your skin and the bag. °¨ Leave the ice on for 15-20 minutes at a time, every 2 hours while you are awake. °· Only take over-the-counter or prescription medicines for pain, discomfort, or fever as directed by  your caregiver. °· Elevate your injured ankle above the level of your heart as much as possible for 2-3 days. °· If your caregiver recommends crutches, use them as instructed. Gradually put weight on the affected ankle. Continue to use crutches or a cane until you can walk without feeling pain in your ankle. °· If you have a plaster splint, wear the splint as directed by your caregiver. Do not rest it on anything harder than a pillow for the first 24 hours. Do not put weight on it. Do not get it wet. You may take it off to take a shower or bath. °· You may have been given an elastic bandage to wear around your ankle to provide support. If the elastic bandage is too tight (you have numbness or tingling in your foot or your foot becomes cold and blue), adjust the bandage to make it comfortable. °· If you have an air splint, you may blow more air into it or let air out to make it more comfortable. You may take your splint off at night and before taking a shower or bath. Wiggle your toes in the splint several times per day to decrease swelling. °SEEK MEDICAL CARE IF:  °· You have rapidly increasing bruising or swelling. °· Your toes feel extremely cold or you lose feeling in your foot. °· Your pain is not relieved with medicine. °SEEK IMMEDIATE MEDICAL CARE IF: °· Your toes are numb or blue. °·   You have severe pain that is increasing. MAKE SURE YOU:   Understand these instructions.  Will watch your condition.  Will get help right away if you are not doing well or get worse.   This information is not intended to replace advice given to you by your health care provider. Make sure you discuss any questions you have with your health care provider.   Document Released: 02/18/2005 Document Revised: 03/11/2014 Document Reviewed: 03/02/2011 Elsevier Interactive Patient Education 2016 [employer]  Tendon Injury Tendons are strong, cordlike structures that connect muscle to bone. Tendons are made up of woven  fibers, like a rope. A tendon injury is a tear (rupture) of the tendon. The rupture may be partial (only a few of the fibers in your tendon rupture) or complete (your entire tendon ruptures). CAUSES  Tendon injuries can be caused by high-stress activities, such as sports. They also can be caused by a repetitive injury or by a single injury from an excessive, rapid force. SYMPTOMS  Symptoms of tendon injury include pain when you move the joint close to the tendon. Other symptoms are swelling, redness, and warmth. DIAGNOSIS  Tendon injuries often can be diagnosed by physical exam. However, sometimes an X-ray exam or advanced imaging, such as magnetic resonance imaging (MRI), is necessary to determine the extent of the injury. TREATMENT  Partial tendon ruptures often can be treated with immobilization. A splint, bandage, or removable brace usually is used to immobilize the injured tendon. Most injured tendons need to be immobilized for 1-2 months before they are completely healed. Complete tendon ruptures may require surgical reattachment.   This information is not intended to replace advice given to you by your health care provider. Make sure you discuss any questions you have with your health care provider.   Document Released: 03/28/2004 Document Revised: 02/07/2011 Document Reviewed: 05/12/2011 Elsevier Interactive Patient Education [employer]

## 2015-07-05 NOTE — ED Provider Notes (Signed)
[facility] Emergency Department Provider Note  ____________________________________________  Time seen: 11:50 PM  I have reviewed the triage vital signs and the nursing notes.   HISTORY  Chief Complaint Foot Pain; Ankle Pain; and Leg Pain     HPI Jennifer Dennis is a 35 y.o. female presents with right ankle/foot pain. Patient states that she twisted her right ankle approximately one week ago and then subsequent step in a hole yesterday with current N out of 10 right ankle pain extending posteriorly on the leg     Past medical history Chronic back pain There are no active problems to display for this patient.   Past Surgical History  Procedure Laterality Date  . Appendectomy    . Oophorectomy    . Ovarian cyst removal      Current Outpatient Rx  Name  Route  Sig  Dispense  Refill  . diazepam (VALIUM) 2 MG tablet   Oral   Take 1 tablet (2 mg total) by mouth every 8 (eight) hours as needed for muscle spasms.   9 tablet   0   . etodolac (LODINE) 400 MG tablet   Oral   Take 1 tablet (400 mg total) by mouth 2 (two) times daily.   20 tablet   0   . HYDROcodone-acetaminophen (NORCO) 5-325 MG tablet   Oral   Take 1 tablet by mouth every 6 (six) hours as needed for moderate pain.   20 tablet   0   . ibuprofen (ADVIL,MOTRIN) 800 MG tablet   Oral   Take 1 tablet (800 mg total) by mouth every 8 (eight) hours as needed.   30 tablet   0   . oxyCODONE-acetaminophen (PERCOCET) 5-325 MG per tablet   Oral   Take 1 tablet by mouth every 4 (four) hours as needed for severe pain.   20 tablet   0     Allergies Sulfur  No family history on file.  Social History Social History  Substance Use Topics  . Smoking status: Current Every Day Smoker -- 1.00 packs/day    Types: Cigarettes  . Smokeless tobacco: None  . Alcohol Use: No    Review of Systems  Constitutional: Negative for fever. Eyes: Negative for visual changes. ENT: Negative  for sore throat. Cardiovascular: Negative for chest pain. Respiratory: Negative for shortness of breath. Gastrointestinal: Negative for abdominal pain, vomiting and diarrhea. Genitourinary: Negative for dysuria. Musculoskeletal: Negative for back pain.Positive right ankle/foot pain Skin: Negative for rash. Neurological: Negative for headaches, focal weakness or numbness.   10-point ROS otherwise negative.  ____________________________________________   PHYSICAL EXAM:  VITAL SIGNS: ED Triage Vitals  Enc Vitals Group     BP 07/04/15 2254 115/101 mmHg     Pulse Rate 07/04/15 2254 85     Resp 07/04/15 2254 20     Temp 07/04/15 2254 98.2 F (36.8 C)     Temp Source 07/04/15 2254 Oral     SpO2 07/04/15 2254 100 %     Weight 07/04/15 2254 140 lb (63.504 kg)     Height 07/04/15 2254 5' (1.524 m)     Head Cir --      Peak Flow --      Pain Score 07/04/15 2254 10     Pain Loc --      Pain Edu? --      Excl. in GC? --      Constitutional: Alert and oriented. Well appearing and in no distress. Eyes:  Conjunctivae are normal. PERRL. Normal extraocular movements. ENT   Head: Normocephalic and atraumatic.   Nose: No congestion/rhinnorhea.   Mouth/Throat: Mucous membranes are moist.   Neck: No stridor. Hematological/Lymphatic/Immunilogical: No cervical lymphadenopathy. Cardiovascular: Normal rate, regular rhythm. Normal and symmetric distal pulses are present in all extremities. No murmurs, rubs, or gallops. Respiratory: Normal respiratory effort without tachypnea nor retractions. Breath sounds are clear and equal bilaterally. No wheezes/rales/rhonchi. Gastrointestinal: Soft and nontender. No distention. There is no CVA tenderness. Genitourinary: deferred Musculoskeletal: Pain with palpation lateral right malleoli. Pain with palpation of the Achilles tendon Neurologic:  Normal speech and language. No gross focal neurologic deficits are appreciated. Speech is normal.   Skin:  Skin is warm, dry and intact. No rash noted. Psychiatric: Mood and affect are normal. Speech and behavior are normal. Patient exhibits appropriate insight and judgment.  ____________________________________________      INITIAL IMPRESSION / ASSESSMENT AND PLAN / ED COURSE  Pertinent labs & imaging results that were available during my care of the patient were reviewed by me and considered in my medical decision making (see chart for details).  Posterior/sugar tong ankle splint placed right leg/foot. Crutches given patient given Percocet emergency department will be prescribed same for home ____________________________________________   FINAL CLINICAL IMPRESSION(S) / ED DIAGNOSES  Final diagnoses:  Right ankle sprain, initial encounter  Achilles tendon injury, right, initial encounter      Darci Current, MD 07/05/15 [phone]

## 2015-10-02 ENCOUNTER — Emergency Department: Payer: Self-pay

## 2015-10-02 DIAGNOSIS — F0781 Postconcussional syndrome: Secondary | ICD-10-CM | POA: Insufficient documentation

## 2015-10-02 DIAGNOSIS — R51 Headache: Secondary | ICD-10-CM | POA: Insufficient documentation

## 2015-10-02 DIAGNOSIS — F17210 Nicotine dependence, cigarettes, uncomplicated: Secondary | ICD-10-CM | POA: Insufficient documentation

## 2015-10-02 NOTE — ED Notes (Addendum)
Pt placed in c-collar
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
## 2015-10-03 ENCOUNTER — Emergency Department
Admission: EM | Admit: 2015-10-03 | Discharge: 2015-10-03 | Disposition: A | Discharge status: Home / Self Care | Payer: Self-pay | Attending: Emergency Medicine | Admitting: Emergency Medicine

## 2015-10-03 DIAGNOSIS — F0781 Postconcussional syndrome: Secondary | ICD-10-CM

## 2015-10-03 MED ORDER — OXYCODONE-ACETAMINOPHEN 10-325 MG PO TABS: 1.0000 | ORAL_TABLET | Freq: Four times a day (QID) | ORAL | 0 refills | Status: DC | PRN

## 2015-10-03 MED ORDER — MORPHINE SULFATE (PF) 2 MG/ML IV SOLN
2.0000 mg | Freq: Once | INTRAVENOUS | Status: AC
  Administered 2015-10-03: 2 mg via INTRAVENOUS
  Filled 2015-10-03: qty 1

## 2015-10-03 MED ORDER — KETOROLAC TROMETHAMINE 30 MG/ML IJ SOLN
30.0000 mg | Freq: Once | INTRAMUSCULAR | Status: AC
  Administered 2015-10-03: 30 mg via INTRAVENOUS
  Filled 2015-10-03: qty 1

## 2015-10-03 MED ORDER — ONDANSETRON HCL 4 MG/2ML IJ SOLN
4.0000 mg | Freq: Once | INTRAMUSCULAR | Status: AC
  Administered 2015-10-03: 4 mg via INTRAVENOUS
  Filled 2015-10-03: qty 2

## 2015-10-03 NOTE — ED Notes (Signed)
Pt reports hx of migraines. Pt reports she normally is able to take OTC medications and have resolution of pain
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
## 2015-10-03 NOTE — ED Notes (Signed)
MD Brown at bedside.

## 2015-11-07 ENCOUNTER — Emergency Department: Payer: Self-pay

## 2015-11-07 ENCOUNTER — Emergency Department
Admission: EM | Admit: 2015-11-07 | Discharge: 2015-11-08 | Disposition: A | Discharge status: Home / Self Care | Payer: Self-pay | Attending: Emergency Medicine | Admitting: Emergency Medicine

## 2015-11-07 ENCOUNTER — Encounter: Payer: Self-pay | Admitting: *Deleted

## 2015-11-07 DIAGNOSIS — M5441 Lumbago with sciatica, right side: Secondary | ICD-10-CM

## 2015-11-07 DIAGNOSIS — R109 Unspecified abdominal pain: Secondary | ICD-10-CM | POA: Insufficient documentation

## 2015-11-07 DIAGNOSIS — F17210 Nicotine dependence, cigarettes, uncomplicated: Secondary | ICD-10-CM | POA: Insufficient documentation

## 2015-11-07 DIAGNOSIS — M5440 Lumbago with sciatica, unspecified side: Secondary | ICD-10-CM | POA: Insufficient documentation

## 2015-11-07 LAB — CBC WITH DIFFERENTIAL/PLATELET
Basophils Absolute: 0.1 10*3/uL (ref 0–0.1)
Basophils Relative: 1 %
EOS ABS: 0.3 10*3/uL (ref 0–0.7)
Eosinophils Relative: 3 %
HEMATOCRIT: 39.2 % (ref 35.0–47.0)
HEMOGLOBIN: 13.6 g/dL (ref 12.0–16.0)
LYMPHS ABS: 3.3 10*3/uL (ref 1.0–3.6)
LYMPHS PCT: 34 %
MCH: 31.1 pg (ref 26.0–34.0)
MCHC: 34.7 g/dL (ref 32.0–36.0)
MCV: 89.7 fL (ref 80.0–100.0)
Monocytes Absolute: 0.6 10*3/uL (ref 0.2–0.9)
Monocytes Relative: 7 %
NEUTROS ABS: 5.2 10*3/uL (ref 1.4–6.5)
NEUTROS PCT: 55 %
Platelets: 250 10*3/uL (ref 150–440)
RBC: 4.38 MIL/uL (ref 3.80–5.20)
RDW: 12.8 % (ref 11.5–14.5)
WBC: 9.5 10*3/uL (ref 3.6–11.0)

## 2015-11-07 LAB — BASIC METABOLIC PANEL
Anion gap: 8 (ref 5–15)
BUN: 10 mg/dL (ref 6–20)
CHLORIDE: 108 mmol/L (ref 101–111)
CO2: 23 mmol/L (ref 22–32)
Calcium: 8.8 mg/dL — ABNORMAL LOW (ref 8.9–10.3)
Creatinine, Ser: 0.73 mg/dL (ref 0.44–1.00)
GFR calc Af Amer: >60 mL/min (ref >60)
GFR calc non Af Amer: >60 mL/min (ref >60)
Glucose, Bld: 97 mg/dL (ref 65–99)
POTASSIUM: 4.5 mmol/L (ref 3.5–5.1)
SODIUM: 139 mmol/L (ref 135–145)

## 2015-11-07 LAB — URINALYSIS COMPLETE WITH MICROSCOPIC (ARMC ONLY)
Bilirubin Urine: NEGATIVE
Glucose, UA: NEGATIVE mg/dL
Ketones, ur: NEGATIVE mg/dL
Leukocytes, UA: NEGATIVE
Nitrite: NEGATIVE
PROTEIN: NEGATIVE mg/dL
SPECIFIC GRAVITY, URINE: 1.008 (ref 1.005–1.030)
pH: 6 (ref 5.0–8.0)

## 2015-11-07 MED ORDER — HYDROMORPHONE HCL 1 MG/ML IJ SOLN
1.0000 mg | Freq: Once | INTRAMUSCULAR | Status: AC
  Administered 2015-11-07: 1 mg via INTRAVENOUS
  Filled 2015-11-07: qty 1

## 2015-11-07 MED ORDER — NAPROXEN 500 MG PO TABS: 500.0000 mg | ORAL_TABLET | Freq: Two times a day (BID) | ORAL | 0 refills | Status: DC

## 2015-11-07 MED ORDER — TIZANIDINE HCL 4 MG PO CAPS: 4.0000 mg | ORAL_CAPSULE | Freq: Four times a day (QID) | ORAL | 0 refills | Status: DC | PRN

## 2015-11-07 MED ORDER — ONDANSETRON HCL 4 MG/2ML IJ SOLN
4.0000 mg | Freq: Once | INTRAMUSCULAR | Status: AC
  Administered 2015-11-07: 4 mg via INTRAVENOUS
  Filled 2015-11-07: qty 2

## 2015-11-07 MED ORDER — OXYCODONE-ACETAMINOPHEN 5-325 MG PO TABS: 1.0000 | ORAL_TABLET | ORAL | 0 refills | Status: DC | PRN

## 2015-11-07 NOTE — ED Provider Notes (Signed)
[facility] Emergency Department Provider Note  ____________________________________________  Time seen: Approximately 10:07 PM  I have reviewed the triage vital signs and the nursing notes.   HISTORY  Chief Complaint Back Pain    HPI Jennifer Dennis is a 35 y.o. female presents with a 3 to four-day progressively worsening right flank and lower back pain radiating into the buttocks. Patient initially thought she had a UTI but has not nose a lot of blood in her urine. 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   History reviewed. No pertinent past medical history.  There are no active problems to display for this patient.   Past Surgical History:  Procedure Laterality Date  . APPENDECTOMY    . OOPHORECTOMY    . OVARIAN CYST REMOVAL      Prior to Admission medications   Medication Sig Start Date End Date Taking? Authorizing Provider  naproxen (NAPROSYN) 500 MG tablet Take 1 tablet (500 mg total) by mouth 2 (two) times daily with a meal. 11/07/15   Evangeline Dakin, PA-C  oxyCODONE-acetaminophen (ROXICET) 5-325 MG tablet Take 1-2 tablets by mouth every 4 (four) hours as needed for severe pain. 11/07/15   Charmayne Sheer Burgandy Hackworth, PA-C  tiZANidine (ZANAFLEX) 4 MG capsule Take 1 capsule (4 mg total) by mouth 4 (four) times daily as needed for muscle spasms. 11/07/15   Evangeline Dakin, PA-C    Allergies Sulfur  History reviewed. No pertinent family history.  Social History Social History  Substance Use Topics  . Smoking status: Current Every Day Smoker    Packs/day: 1.00    Types: Cigarettes  . Smokeless tobacco: Never Used  . Alcohol use No    Review of Systems Constitutional: No fever/chills Gastrointestinal: No abdominal pain.  No nausea, no vomiting.  No diarrhea.  No constipation. Genitourinary: Negative for dysuria. Musculoskeletal: Positive for lower back pain. Skin: Negative for rash. Neurological: Negative for  headaches, focal weakness or numbness.  10-point ROS otherwise negative.  ____________________________________________   PHYSICAL EXAM:  VITAL SIGNS: ED Triage Vitals  Enc Vitals Group     BP --      Pulse --      Resp --      Temp --      Temp src --      SpO2 --      Weight 11/07/15 2159 125 lb (56.7 kg)     Height 11/07/15 2159 5' (1.524 m)     Head Circumference --      Peak Flow --      Pain Score 11/07/15 2200 8     Pain Loc --      Pain Edu? --      Excl. in GC? --     Constitutional: Alert and oriented. Well appearing and in no acute distress. Eyes: Conjunctivae are normal. PERRL. EOMI. Head: Atraumatic. Nose: No congestion/rhinnorhea. Mouth/Throat: Mucous membranes are moist.  Oropharynx non-erythematous. Neck: No stridor.   Cardiovascular: Normal rate, regular rhythm. Grossly normal heart sounds.  Good peripheral circulation. Respiratory: Normal respiratory effort.  No retractions. Lungs CTAB. Gastrointestinal: Soft and nontender. No distention. No abdominal bruits. No CVA tenderness. Musculoskeletal: No lower extremity tenderness nor edema.  No joint effusions. Neurologic:  Normal speech and language. No gross focal neurologic deficits are appreciated. No gait instability. Skin:  Skin is warm, dry and intact. No rash noted. Psychiatric: Mood and affect are normal. Speech and behavior are normal.  ____________________________________________  LABS (all labs ordered are listed, but only abnormal results are displayed)  Labs Reviewed  BASIC METABOLIC PANEL - Abnormal; Notable for the following:       Result Value   Calcium 8.8 (*)    All other components within normal limits  URINALYSIS COMPLETEWITH MICROSCOPIC ([facility] ONLY) - Abnormal; Notable for the following:    Color, Urine STRAW (*)    APPearance CLEAR (*)    Hgb urine dipstick 1+ (*)    Bacteria, UA RARE (*)    Squamous Epithelial / LPF 0-5 (*)    All other components within normal limits  CBC  WITH DIFFERENTIAL/PLATELET   ____________________________________________  EKG   ____________________________________________  RADIOLOGY IMPRESSION:  No hydronephrosis or nephrolithiasis.    Mildly prominent small bowel loops in the left hemi abdomen may  represent enteritis. Clinical correlation is recommended. No bowel  obstruction.    Nodular soft tissue density in the right hemipelvis as seen on the  prior studies of indeterminate etiology and clinical significance.  MRI without and with contrast may provide better characterisation.  There has been no significant interval change in the size of this  lesion compared to the CT dated 08/10/2014.     ____________________________________________   PROCEDURES  Procedure(s) performed: None  Critical Care performed: No  ____________________________________________   INITIAL IMPRESSION / ASSESSMENT AND PLAN / ED COURSE  Pertinent labs & imaging results that were available during my care of the patient were reviewed by me and considered in my medical decision making (see chart for details).  Review of the [place] CSRS was performed in accordance of the [facility] prior to dispensing any controlled drugs.  Acute sciatica with back pain. Rx given for Zanaflex, Naprosyn 500 and by him. Patient follow-up PCP or return ER with any worsening symptomology.  Clinical Course    ____________________________________________   FINAL CLINICAL IMPRESSION(S) / ED DIAGNOSES  Final diagnoses:  Right-sided low back pain with sciatica, sciatica laterality unspecified     This chart was dictated using voice recognition software/Dragon. Despite best efforts to proofread, errors can occur which can change the meaning. Any change was purely unintentional.    Evangeline Dakinharles M Meli Faley, PA-C 11/07/15 [id number]    Sharman CheekPhillip Stafford, MD 11/07/15 [phone]

## 2015-11-07 NOTE — ED Triage Notes (Addendum)
Pt c/o R sided low to mid-back pain that started this past Friday and worsened over time. Pt states pain radiates to R buttock and back of leg and upward along R back. Pt states she took both RX and OTC meds for pain w/ little relief. Pt denies injury, endorses some dysuria and states she feels as if she cannot empty her bladder.

## 2015-11-12 ENCOUNTER — Encounter: Payer: Self-pay | Admitting: *Deleted

## 2015-11-12 ENCOUNTER — Emergency Department
Admission: EM | Admit: 2015-11-12 | Discharge: 2015-11-13 | Disposition: A | Discharge status: Home / Self Care | Payer: Self-pay | Attending: Emergency Medicine | Admitting: Emergency Medicine

## 2015-11-12 DIAGNOSIS — R109 Unspecified abdominal pain: Secondary | ICD-10-CM

## 2015-11-12 DIAGNOSIS — Z79899 Other long term (current) drug therapy: Secondary | ICD-10-CM | POA: Insufficient documentation

## 2015-11-12 DIAGNOSIS — R1031 Right lower quadrant pain: Secondary | ICD-10-CM | POA: Insufficient documentation

## 2015-11-12 DIAGNOSIS — M5431 Sciatica, right side: Secondary | ICD-10-CM

## 2015-11-12 DIAGNOSIS — M5441 Lumbago with sciatica, right side: Secondary | ICD-10-CM | POA: Insufficient documentation

## 2015-11-12 DIAGNOSIS — F17210 Nicotine dependence, cigarettes, uncomplicated: Secondary | ICD-10-CM | POA: Insufficient documentation

## 2015-11-12 DIAGNOSIS — R202 Paresthesia of skin: Secondary | ICD-10-CM | POA: Insufficient documentation

## 2015-11-12 LAB — URINALYSIS COMPLETE WITH MICROSCOPIC (ARMC ONLY)
BILIRUBIN URINE: NEGATIVE
GLUCOSE, UA: NEGATIVE mg/dL
Hgb urine dipstick: NEGATIVE
Leukocytes, UA: NEGATIVE
Nitrite: NEGATIVE
PH: 6 (ref 5.0–8.0)
PROTEIN: NEGATIVE mg/dL
SPECIFIC GRAVITY, URINE: 1.016 (ref 1.005–1.030)

## 2015-11-12 LAB — COMPREHENSIVE METABOLIC PANEL
ALBUMIN: 4.6 g/dL (ref 3.5–5.0)
ALK PHOS: 32 U/L — AB (ref 38–126)
ALT: 9 U/L — AB (ref 14–54)
ANION GAP: 5 (ref 5–15)
AST: 18 U/L (ref 15–41)
BILIRUBIN TOTAL: 0.4 mg/dL (ref 0.3–1.2)
BUN: 14 mg/dL (ref 6–20)
CALCIUM: 9.4 mg/dL (ref 8.9–10.3)
CO2: 25 mmol/L (ref 22–32)
CREATININE: 0.95 mg/dL (ref 0.44–1.00)
Chloride: 107 mmol/L (ref 101–111)
GFR calc Af Amer: >60 mL/min (ref >60)
GFR calc non Af Amer: >60 mL/min (ref >60)
GLUCOSE: 158 mg/dL — AB (ref 65–99)
Potassium: 3.7 mmol/L (ref 3.5–5.1)
SODIUM: 137 mmol/L (ref 135–145)
Total Protein: 7.6 g/dL (ref 6.5–8.1)

## 2015-11-12 LAB — CBC
HCT: 40.3 % (ref 35.0–47.0)
HEMOGLOBIN: 14.3 g/dL (ref 12.0–16.0)
MCH: 31.3 pg (ref 26.0–34.0)
MCHC: 35.4 g/dL (ref 32.0–36.0)
MCV: 88.3 fL (ref 80.0–100.0)
Platelets: 264 10*3/uL (ref 150–440)
RBC: 4.57 MIL/uL (ref 3.80–5.20)
RDW: 12.9 % (ref 11.5–14.5)
WBC: 9.9 10*3/uL (ref 3.6–11.0)

## 2015-11-12 LAB — LIPASE, BLOOD: Lipase: 43 U/L (ref 11–51)

## 2015-11-12 NOTE — ED Triage Notes (Signed)
Pt reports low abd pain and right leg pain.  Pt was seen in er last week and dx with sciatica.  Pt states pain is no better in right leg.  Pt reports vomiting x 1 today.  No dysuria.  No vag bleeding  No vag discharge.   Pt alert.

## 2015-11-12 NOTE — ED Notes (Signed)
Patient states her leg is numb and not causing any pain currently but she does still have pain in the lower right abdomen radiating into the back.   Denies difficulty urinating, change in odor, nor history of kidney stones.

## 2015-11-13 ENCOUNTER — Emergency Department: Payer: Self-pay

## 2015-11-13 LAB — PREGNANCY, URINE: Preg Test, Ur: NEGATIVE

## 2015-11-13 MED ORDER — CYCLOBENZAPRINE HCL 10 MG PO TABS: 10.0000 mg | ORAL_TABLET | Freq: Three times a day (TID) | ORAL | 0 refills | Status: DC | PRN

## 2015-11-13 MED ORDER — KETOROLAC TROMETHAMINE 60 MG/2ML IM SOLN
60.0000 mg | Freq: Once | INTRAMUSCULAR | Status: AC
  Administered 2015-11-13: 60 mg via INTRAMUSCULAR
  Filled 2015-11-13: qty 2

## 2015-11-13 MED ORDER — LIDOCAINE 5 % EX PTCH: 1.0000 | MEDICATED_PATCH | Freq: Two times a day (BID) | CUTANEOUS | 0 refills | Status: AC

## 2015-11-13 MED ORDER — ETODOLAC 200 MG PO CAPS: 200.0000 mg | ORAL_CAPSULE | Freq: Three times a day (TID) | ORAL | 0 refills | Status: DC

## 2015-11-13 MED ORDER — DIAZEPAM 2 MG PO TABS
2.0000 mg | ORAL_TABLET | Freq: Once | ORAL | Status: DC
  Filled 2015-11-13: qty 1

## 2015-11-13 MED ORDER — LIDOCAINE 5 % EX PTCH
1.0000 | MEDICATED_PATCH | CUTANEOUS | Status: DC
  Administered 2015-11-13: 1 via TRANSDERMAL
  Filled 2015-11-13: qty 1

## 2015-11-13 NOTE — ED Notes (Signed)
Patient transported to Ultrasound 

## 2015-11-13 NOTE — ED Provider Notes (Signed)
[facility] Emergency Department Provider Note   ____________________________________________   First MD Initiated Contact with Patient 11/13/15 0104     (approximate)  I have reviewed the triage vital signs and the nursing notes.   HISTORY  Chief Complaint Sciatica and Abdominal Pain    HPI Jennifer Dennis is a 35 y.o. female who comes into the hospital today with back pain, right lower quadrant pain and leg numbness. She reports that she was here week ago was told that her back pain was due to sciatic nerve pain. She was told that if it moved to her stomach or her legs went numb that she should come back to the hospital. She reports that the pain is in her right back and 2 days ago moved into her right lower leg close to her groin. She also reports that her entire right leg is numb. The patient has been taking ibuprofen for pain but has not helped. She denies any weakness and has no inability to walk. She reports that her fianc brought her in for evaluation. The patient rates her pain 8 out of 10 in intensity currently. She denies any fall and thought that initially it was a kidney infection but when the pain moved to her but she became concerned.The patient is here for evaluation.   No past medical history  There are no active problems to display for this patient.   Past Surgical History:  Procedure Laterality Date  . APPENDECTOMY    . OOPHORECTOMY    . OVARIAN CYST REMOVAL      Prior to Admission medications   Medication Sig Start Date End Date Taking? Authorizing Provider  cyclobenzaprine (FLEXERIL) 10 MG tablet Take 1 tablet (10 mg total) by mouth 3 (three) times daily as needed for muscle spasms. 11/13/15   Rebecka Apley, MD  etodolac (LODINE) 200 MG capsule Take 1 capsule (200 mg total) by mouth every 8 (eight) hours. 11/13/15   Rebecka Apley, MD  lidocaine (LIDODERM) 5 % Place 1 patch onto the skin every 12 (twelve) hours. Remove &  Discard patch within 12 hours or as directed by MD 11/13/15 11/12/16  Rebecka Apley, MD  naproxen (NAPROSYN) 500 MG tablet Take 1 tablet (500 mg total) by mouth 2 (two) times daily with a meal. 11/07/15   Evangeline Dakin, PA-C  oxyCODONE-acetaminophen (ROXICET) 5-325 MG tablet Take 1-2 tablets by mouth every 4 (four) hours as needed for severe pain. 11/07/15   Charmayne Sheer Beers, PA-C  tiZANidine (ZANAFLEX) 4 MG capsule Take 1 capsule (4 mg total) by mouth 4 (four) times daily as needed for muscle spasms. 11/07/15   Evangeline Dakin, PA-C    Allergies Sulfur  No family history on file.  Social History Social History  Substance Use Topics  . Smoking status: Current Every Day Smoker    Packs/day: 1.00    Types: Cigarettes  . Smokeless tobacco: Never Used  . Alcohol use No    Review of Systems Constitutional: No fever/chills Eyes: No visual changes. ENT: No sore throat. Cardiovascular: Denies chest pain. Respiratory: Denies shortness of breath. Gastrointestinal:  abdominal pain.  No nausea, no vomiting.  No diarrhea.  No constipation. Genitourinary: Negative for dysuria. Musculoskeletal: Right back pain. Skin: Negative for rash. Neurological: Right leg numbness  10-point ROS otherwise negative.  ____________________________________________   PHYSICAL EXAM:  VITAL SIGNS: ED Triage Vitals  Enc Vitals Group     BP 11/12/15 2134 110/68  Pulse Rate 11/12/15 2134 100     Resp 11/12/15 2134 18     Temp 11/12/15 2134 98.8 F (37.1 C)     Temp Source 11/12/15 2134 Oral     SpO2 11/12/15 2134 99 %     Weight 11/12/15 2135 125 lb (56.7 kg)     Height 11/12/15 2135 5' (1.524 m)     Head Circumference --      Peak Flow --      Pain Score 11/12/15 2135 8     Pain Loc --      Pain Edu? --      Excl. in GC? --     Constitutional: Alert and oriented. Well appearing and in Mild to moderate distress. Eyes: Conjunctivae are normal. PERRL. EOMI. Head: Atraumatic. Nose: No  congestion/rhinnorhea. Mouth/Throat: Mucous membranes are moist.  Oropharynx non-erythematous. Cardiovascular: Normal rate, regular rhythm. Grossly normal heart sounds.  Good peripheral circulation. Respiratory: Normal respiratory effort.  No retractions. Lungs CTAB. Gastrointestinal: Soft with some right lower quadrant tenderness to palpation. No distention. Positive bowel sounds Musculoskeletal: Tenderness palpation of right SI joint.  Neurologic:  Normal speech and language. Numbness to the right leg entirely, not following any dermatomes distinctly. Skin:  Skin is warm, dry and intact.  Psychiatric: Mood and affect are normal.   ____________________________________________   LABS (all labs ordered are listed, but only abnormal results are displayed)  Labs Reviewed  COMPREHENSIVE METABOLIC PANEL - Abnormal; Notable for the following:       Result Value   Glucose, Bld 158 (*)    ALT 9 (*)    Alkaline Phosphatase 32 (*)    All other components within normal limits  URINALYSIS COMPLETEWITH MICROSCOPIC ([facility] ONLY) - Abnormal; Notable for the following:    Color, Urine YELLOW (*)    APPearance CLEAR (*)    Ketones, ur TRACE (*)    Bacteria, UA RARE (*)    Squamous Epithelial / LPF 0-5 (*)    All other components within normal limits  LIPASE, BLOOD  CBC  PREGNANCY, URINE   ____________________________________________  EKG  none ____________________________________________  RADIOLOGY  US abd and pelvis ____________________________________________   PROCEDURES  Procedure(s) performed: None  Procedures  Critical Care performed: No  ____________________________________________   INITIAL IMPRESSION / ASSESSMENT AND PLAN / ED COURSE  Pertinent labs & imaging results that were available during my care of the patient were reviewed by me and considered in my medical decision making (see chart for details).  This is a 35 year old female who comes into the hospital  today with some right-sided back pain as well as abdominal pain and leg numbness. The patient reports that she had a CT scan when she was here previously. I will perform an ultrasound to ensure that the patient does not have any ovarian cyst or torsion causing her pain. I will give the patient a dose of Toradol and Valium as well as a Lidoderm patch. The patient will be reassessed.  Clinical Course  Value Comment By Time  US Pelvis Complete No visible ovary. No abnormal pelvic fluid collections. No mass is evident.   Rebecka ApleyAllison P Felicita Nuncio, MD 09/11 [phone]   The patient is sitting well without any difficulty. 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 I did not perform a pelvic exam on the patient. I do not feel she needs any further imaging at this time. She does need to follow-up for her sciatica. The patient also disclosed that  she does have some slipped discs in her back. She will be discharged home to follow-up with orthopedic surgery.  ____________________________________________   FINAL CLINICAL IMPRESSION(S) / ED DIAGNOSES  Final diagnoses:  Abdominal pain  Sciatica of right side  Paresthesia      NEW MEDICATIONS STARTED DURING THIS VISIT:  Discharge Medication List as of 11/13/2015  4:26 AM    START taking these medications   Details  cyclobenzaprine (FLEXERIL) 10 MG tablet Take 1 tablet (10 mg total) by mouth 3 (three) times daily as needed for muscle spasms., Starting Mon 11/13/2015, Print    etodolac (LODINE) 200 MG capsule Take 1 capsule (200 mg total) by mouth every 8 (eight) hours., Starting Mon 11/13/2015, Print    lidocaine (LIDODERM) 5 % Place 1 patch onto the skin every 12 (twelve) hours. Remove & Discard patch within 12 hours or as directed by MD, Starting Mon 11/13/2015, Until Tue 11/12/2016, Print         Note:  This document was prepared using Dragon voice recognition software and may include unintentional dictation errors.    Rebecka Apley, MD 11/13/15 [phone]

## 2015-11-13 NOTE — ED Notes (Signed)
Reviewed d/c instructions, follow-up care and prescriptions with pt. Pt verbalized understanding 

## 2015-12-26 ENCOUNTER — Emergency Department
Admission: EM | Admit: 2015-12-26 | Discharge: 2015-12-26 | Disposition: A | Discharge status: Home / Self Care | Payer: Self-pay | Attending: Emergency Medicine | Admitting: Emergency Medicine

## 2015-12-26 ENCOUNTER — Encounter: Payer: Self-pay | Admitting: Emergency Medicine

## 2015-12-26 DIAGNOSIS — F17210 Nicotine dependence, cigarettes, uncomplicated: Secondary | ICD-10-CM | POA: Insufficient documentation

## 2015-12-26 DIAGNOSIS — Z79899 Other long term (current) drug therapy: Secondary | ICD-10-CM | POA: Insufficient documentation

## 2015-12-26 DIAGNOSIS — M545 Low back pain, unspecified: Secondary | ICD-10-CM

## 2015-12-26 LAB — URINALYSIS COMPLETE WITH MICROSCOPIC (ARMC ONLY)
Bacteria, UA: NONE SEEN
Bilirubin Urine: NEGATIVE
GLUCOSE, UA: NEGATIVE mg/dL
Ketones, ur: NEGATIVE mg/dL
LEUKOCYTES UA: NEGATIVE
NITRITE: NEGATIVE
PROTEIN: NEGATIVE mg/dL
Specific Gravity, Urine: 1.017 (ref 1.005–1.030)
pH: 6 (ref 5.0–8.0)

## 2015-12-26 MED ORDER — ORPHENADRINE CITRATE 30 MG/ML IJ SOLN
60.0000 mg | INTRAMUSCULAR | Status: AC
  Administered 2015-12-26: 60 mg via INTRAMUSCULAR
  Filled 2015-12-26: qty 2

## 2015-12-26 MED ORDER — KETOROLAC TROMETHAMINE 60 MG/2ML IM SOLN
30.0000 mg | Freq: Once | INTRAMUSCULAR | Status: AC
  Administered 2015-12-26: 30 mg via INTRAMUSCULAR
  Filled 2015-12-26: qty 2

## 2015-12-26 MED ORDER — KETOROLAC TROMETHAMINE 10 MG PO TABS: 10.0000 mg | ORAL_TABLET | Freq: Three times a day (TID) | ORAL | 0 refills | Status: DC

## 2015-12-26 MED ORDER — DIAZEPAM 2 MG PO TABS: 2.0000 mg | ORAL_TABLET | Freq: Two times a day (BID) | ORAL | 0 refills | Status: DC | PRN

## 2015-12-26 NOTE — Discharge Instructions (Signed)
Review of your previous MRI, CT scans, and x-rays shows only some mild disc disease at L5-S1. Your sciatic nerve irritation may be due to muscle spasm and strain as opposed to a herniated or bulging disc. You should take the prescription anti-inflammatory as directed. Once the 15 pills are completed, begin to take OTC ibuprofen or naproxen. Take the valium for muscle pain and spasm as directed. Use the attached rehab exercises to reduce symptoms. [facility] with [facility] for further management. You should also select a local provider at one of the local community clinics.

## 2015-12-26 NOTE — ED Triage Notes (Signed)
Patient to ER for c/o low back pain to right side. Patient denies any current injury, but reports back issues since moving sheet rock approx one year ago. Was diagnosed with sciatica in the past, but states she has now developed difficulty controlling her bladder. Patient ambulatory to stat desk.

## 2015-12-26 NOTE — ED Triage Notes (Addendum)
Pt presents to ED with lower back pain that radiates down her left leg. Hx of the same and was dx with sciatica. Last time with similar symptoms was about a month ago. No new injury.

## 2015-12-26 NOTE — ED Notes (Signed)
Pt reports she has a HX of sciatica but sts "I don't remember what they told me to do about it."

## 2015-12-27 NOTE — ED Provider Notes (Signed)
[facility] Emergency Department Provider Note ____________________________________________  Time seen: 2125  I have reviewed the triage vital signs and the nursing notes.  HISTORY  Chief Complaint  Back Pain  HPI Jennifer Dennis is a 35 y.o. female presents to the ED forthe fourth visit in 2 months, and the third to this ED, for right sciatic irritation. The patient gives a subjective history of multiple herniated discs, but review of her only MRI does not confirm any DDD or HNPs. She was most [facility] with prescriptions for Lidoderm patches and Flexeril. She denies any benefit from either medication. She denies any recent injury, trauma, or accidents. She reports earlier today, she experienced some bladder leakage as she tried to walk to the bathroom. She also describes a normal bowel movement without incontinence. She has not followed-up with ortho spine as she has been previously referred.   History reviewed. No pertinent past medical history.  There are no active problems to display for this patient.  Past Surgical History:  Procedure Laterality Date  . APPENDECTOMY    . OOPHORECTOMY    . OVARIAN CYST REMOVAL      Prior to Admission medications   Medication Sig Start Date End Date Taking? Authorizing Provider  cyclobenzaprine (FLEXERIL) 10 MG tablet Take 1 tablet (10 mg total) by mouth 3 (three) times daily as needed for muscle spasms. 11/13/15   Rebecka ApleyAllison P Webster, MD  diazepam (VALIUM) 2 MG tablet Take 1 tablet (2 mg total) by mouth every 12 (twelve) hours as needed for muscle spasms. 12/26/15   Maryclaire Stoecker V Bacon Shylee Durrett, PA-C  etodolac (LODINE) 200 MG capsule Take 1 capsule (200 mg total) by mouth every 8 (eight) hours. 11/13/15   Rebecka ApleyAllison P Webster, MD  ketorolac (TORADOL) 10 MG tablet Take 1 tablet (10 mg total) by mouth every 8 (eight) hours. 12/26/15   Magon Croson V Bacon Tavari Loadholt, PA-C  lidocaine (LIDODERM) 5 % Place 1 patch onto the skin every  12 (twelve) hours. Remove & Discard patch within 12 hours or as directed by MD 11/13/15 11/12/16  Rebecka ApleyAllison P Webster, MD  naproxen (NAPROSYN) 500 MG tablet Take 1 tablet (500 mg total) by mouth 2 (two) times daily with a meal. 11/07/15   Evangeline Dakinharles M Beers, PA-C  oxyCODONE-acetaminophen (ROXICET) 5-325 MG tablet Take 1-2 tablets by mouth every 4 (four) hours as needed for severe pain. 11/07/15   [name], PA-C  tiZANidine (ZANAFLEX) 4 MG capsule Take 1 capsule (4 mg total) by mouth 4 (four) times daily as needed for muscle spasms. 11/07/15   Evangeline Dakinharles M Beers, PA-C    Allergies Prednisone and Sulfur  No family history on file.  Social History Social History  Substance Use Topics  . Smoking status: Current Every Day Smoker    Packs/day: 1.00    Types: Cigarettes  . Smokeless tobacco: Never Used  . Alcohol use No    Review of Systems  Constitutional: Negative for fever. Cardiovascular: Negative for chest pain. Respiratory: Negative for shortness of breath. Gastrointestinal: Negative for abdominal pain, vomiting and diarrhea. Genitourinary: Negative for dysuria. Musculoskeletal: Positive for back pain. Skin: Negative for rash. Neurological: Negative for headaches, focal weakness or numbness. ____________________________________________  PHYSICAL EXAM:  VITAL SIGNS: ED Triage Vitals  Enc Vitals Group     BP 12/26/15 2051 111/77     Pulse Rate 12/26/15 2051 100     Resp 12/26/15 2051 20     Temp 12/26/15 2051 98.1 F (36.7  C)     Temp Source 12/26/15 2051 Oral     SpO2 12/26/15 2051 99 %     Weight 12/26/15 2052 130 lb (59 kg)     Height 12/26/15 2052 5' (1.524 m)     Head Circumference --      Peak Flow --      Pain Score 12/26/15 2052 8     Pain Loc --      Pain Edu? --      Excl. in GC? --     Constitutional: Alert and oriented. Well appearing and in no distress. Head: Normocephalic and atraumatic. Cardiovascular: Normal rate, regular rhythm. Normal distal  pulses. Respiratory: Normal respiratory effort. No wheezes/rales/rhonchi. Gastrointestinal: Soft and nontender. No distention. Musculoskeletal:No spinal alignment without midline tenderness, spasm, deformity, or step-off. Patient with normal transition from supine to sit and sit to stand without assistance. Negative seated straight leg raise bilaterally. Normal toe raise and heel raise on exam. Normal tandem walk. Nontender with normal range of motion in all extremities.  Neurologic: CN II-XII grossly intact. Normal LE DTRs bilaterally. Normal gait without ataxia. Normal speech and language. No gross focal neurologic deficits are appreciated. Skin:  Skin is warm, dry and intact. No rash noted. Psychiatric: Mood and affect are normal. Patient exhibits appropriate insight and judgment. ____________________________________________  PROCEDURES  Toradol 30 mg IM Norflex 60 mg IM ____________________________________________  INITIAL IMPRESSION / ASSESSMENT AND PLAN / ED COURSE  Patient with acute right-sided low back pain with sciatic nerve irritation. Her exam is overall benign without any findings consistent with an acute neurological deficit. She is discharged with prescriptions for Valium and ketorolac to dose as directed. She will follow up with [facility] spinal clinic as previously directed. She is also encouraged his left a primary care provider for routine medical care. Return precautions are reviewed.  Clinical Course   ____________________________________________  FINAL CLINICAL IMPRESSION(S) / ED DIAGNOSES  Final diagnoses:  Acute right-sided low back pain without sciatica      Lissa Hoard, PA-C 12/27/15 0031    Lissa Hoard, PA-C 12/27/15 0032    Nita Sickle, MD 12/29/15 [phone]

## 2016-01-27 ENCOUNTER — Emergency Department
Admission: EM | Admit: 2016-01-27 | Discharge: 2016-01-27 | Disposition: A | Discharge status: Home / Self Care | Payer: Self-pay | Attending: Emergency Medicine | Admitting: Emergency Medicine

## 2016-01-27 ENCOUNTER — Encounter: Payer: Self-pay | Admitting: Emergency Medicine

## 2016-01-27 DIAGNOSIS — Y93F2 Activity, caregiving, lifting: Secondary | ICD-10-CM | POA: Insufficient documentation

## 2016-01-27 DIAGNOSIS — F17210 Nicotine dependence, cigarettes, uncomplicated: Secondary | ICD-10-CM | POA: Insufficient documentation

## 2016-01-27 DIAGNOSIS — Y929 Unspecified place or not applicable: Secondary | ICD-10-CM | POA: Insufficient documentation

## 2016-01-27 DIAGNOSIS — Y999 Unspecified external cause status: Secondary | ICD-10-CM | POA: Insufficient documentation

## 2016-01-27 DIAGNOSIS — M5441 Lumbago with sciatica, right side: Secondary | ICD-10-CM | POA: Insufficient documentation

## 2016-01-27 DIAGNOSIS — Z791 Long term (current) use of non-steroidal anti-inflammatories (NSAID): Secondary | ICD-10-CM | POA: Insufficient documentation

## 2016-01-27 DIAGNOSIS — X500XXA Overexertion from strenuous movement or load, initial encounter: Secondary | ICD-10-CM | POA: Insufficient documentation

## 2016-01-27 DIAGNOSIS — Z79899 Other long term (current) drug therapy: Secondary | ICD-10-CM | POA: Insufficient documentation

## 2016-01-27 DIAGNOSIS — G8929 Other chronic pain: Secondary | ICD-10-CM | POA: Insufficient documentation

## 2016-01-27 MED ORDER — MELOXICAM 15 MG PO TABS: 15.0000 mg | ORAL_TABLET | Freq: Every day | ORAL | 0 refills | Status: DC

## 2016-01-27 MED ORDER — ORPHENADRINE CITRATE 30 MG/ML IJ SOLN
60.0000 mg | Freq: Once | INTRAMUSCULAR | Status: AC
  Administered 2016-01-27: 60 mg via INTRAMUSCULAR
  Filled 2016-01-27: qty 2

## 2016-01-27 MED ORDER — KETOROLAC TROMETHAMINE 30 MG/ML IJ SOLN
30.0000 mg | Freq: Once | INTRAMUSCULAR | Status: AC
  Administered 2016-01-27: 30 mg via INTRAMUSCULAR
  Filled 2016-01-27: qty 1

## 2016-01-27 MED ORDER — CYCLOBENZAPRINE HCL 10 MG PO TABS: 10.0000 mg | ORAL_TABLET | Freq: Three times a day (TID) | ORAL | 0 refills | Status: DC | PRN

## 2016-01-27 NOTE — ED Notes (Addendum)
Pt ambulated to room 43. Pt states back pain after lifting a 50lb bag of dog food.

## 2016-01-27 NOTE — ED Provider Notes (Signed)
[facility] Emergency Department Provider Note  ____________________________________________  Time seen: Approximately 4:02 PM  I have reviewed the triage vital signs and the nursing notes.   HISTORY  Chief Complaint Back Pain    HPI Jennifer Dennis is a 35 y.o. female who presents emergency department complaining of sharp right-sided lower back pain. Patient states that she has a history of sciatica and tried to lift a 50 pound bag of dog food. Patient states that the pain has sharply increased from baseline. Patient denies taking any medications for this complaint prior to arrival. She denies any numbness or tingling in distal extremities. She denies any saddle anesthesia, paresthesias, bowel or bladder dysfunction. Patient has a long history of chronic back pain but states that she does not have insurance and is not been able to see orthopedics or spine specialty because of same.   History reviewed. No pertinent past medical history.  There are no active problems to display for this patient.   Past Surgical History:  Procedure Laterality Date  . APPENDECTOMY    . OOPHORECTOMY    . OVARIAN CYST REMOVAL      Prior to Admission medications   Medication Sig Start Date End Date Taking? Authorizing Provider  cyclobenzaprine (FLEXERIL) 10 MG tablet Take 1 tablet (10 mg total) by mouth 3 (three) times daily as needed for muscle spasms. 01/27/16   Delorise Royals Almena Hokenson, PA-C  diazepam (VALIUM) 2 MG tablet Take 1 tablet (2 mg total) by mouth every 12 (twelve) hours as needed for muscle spasms. 12/26/15   Jenise V Bacon Menshew, PA-C  etodolac (LODINE) 200 MG capsule Take 1 capsule (200 mg total) by mouth every 8 (eight) hours. 11/13/15   Rebecka Apley, MD  ketorolac (TORADOL) 10 MG tablet Take 1 tablet (10 mg total) by mouth every 8 (eight) hours. 12/26/15   Jenise V Bacon Menshew, PA-C  lidocaine (LIDODERM) 5 % Place 1 patch onto the skin every 12 (twelve)  hours. Remove & Discard patch within 12 hours or as directed by MD 11/13/15 11/12/16  Rebecka Apley, MD  meloxicam (MOBIC) 15 MG tablet Take 1 tablet (15 mg total) by mouth daily. 01/27/16   Delorise Royals Benney Sommerville, PA-C  naproxen (NAPROSYN) 500 MG tablet Take 1 tablet (500 mg total) by mouth 2 (two) times daily with a meal. 11/07/15   Evangeline Dakin, PA-C  oxyCODONE-acetaminophen (ROXICET) 5-325 MG tablet Take 1-2 tablets by mouth every 4 (four) hours as needed for severe pain. 11/07/15   Charmayne Sheer Beers, PA-C  tiZANidine (ZANAFLEX) 4 MG capsule Take 1 capsule (4 mg total) by mouth 4 (four) times daily as needed for muscle spasms. 11/07/15   Evangeline Dakin, PA-C    Allergies Prednisone and Sulfur  No family history on file.  Social History Social History  Substance Use Topics  . Smoking status: Current Every Day Smoker    Packs/day: 1.00    Types: Cigarettes  . Smokeless tobacco: Never Used  . Alcohol use No     Review of Systems  Constitutional: No fever/chills Cardiovascular: no chest pain. Respiratory: no cough. No SOB. Gastrointestinal: No abdominal pain.  No nausea, no vomiting.  No diarrhea.  No constipation. Genitourinary: Negative for dysuria. No hematuria Musculoskeletal: Negative for musculoskeletal pain. Skin: Negative for rash, abrasions, lacerations, ecchymosis. Neurological: Negative for headaches, focal weakness or numbness. 10-point ROS otherwise negative.  ____________________________________________   PHYSICAL EXAM:  VITAL SIGNS: ED Triage Vitals  Enc Vitals Group  BP 01/27/16 1517 103/78     Pulse Rate 01/27/16 1517 75     Resp 01/27/16 1517 14     Temp 01/27/16 1517 98.1 F (36.7 C)     Temp Source 01/27/16 1517 Oral     SpO2 01/27/16 1517 100 %     Weight 01/27/16 1518 125 lb (56.7 kg)     Height 01/27/16 1518 5' (1.524 m)     Head Circumference --      Peak Flow --      Pain Score 01/27/16 1518 8     Pain Loc --      Pain Edu? --      Excl.  in GC? --      Constitutional: Alert and oriented. Well appearing and in no acute distress. Eyes: Conjunctivae are normal. PERRL. EOMI. Head: Atraumatic. Neck: No stridor.    Cardiovascular: Normal rate, regular rhythm. Normal S1 and S2.  Good peripheral circulation. Respiratory: Normal respiratory effort without tachypnea or retractions. Lungs CTAB. Good air entry to the bases with no decreased or absent breath sounds. Musculoskeletal: Full range of motion to all extremities. No gross deformities appreciated. No deformities to spine upon inspection. Patient is diffusely tender to palpation right paraspinal muscle group. No palpable abnormality. Patient is nontender to palpation over left paraspinal muscle group or midline spinal processes. Patient is tender to palpation over the right-sided sciatic notch. Negative straight leg raise bilaterally. Dorsalis pedis pulse intact bilateral lower extremities. Sensation intact and equal lower extremities. Neurologic:  Normal speech and language. No gross focal neurologic deficits are appreciated.  Skin:  Skin is warm, dry and intact. No rash noted. Psychiatric: Mood and affect are normal. Speech and behavior are normal. Patient exhibits appropriate insight and judgement.   ____________________________________________   LABS (all labs ordered are listed, but only abnormal results are displayed)  Labs Reviewed - No data to display ____________________________________________  EKG   ____________________________________________  RADIOLOGY  No results found.  ____________________________________________    PROCEDURES  Procedure(s) performed:    Procedures    Medications  ketorolac (TORADOL) 30 MG/ML injection 30 mg (not administered)  orphenadrine (NORFLEX) injection 60 mg (not administered)     ____________________________________________   INITIAL IMPRESSION / ASSESSMENT AND PLAN / ED COURSE  Pertinent labs & imaging  results that were available during my care of the patient were reviewed by me and considered in my medical decision making (see chart for details).  Review of the [place] CSRS was performed in accordance of the [facility] prior to dispensing any controlled drugs.  Clinical Course     Patient's diagnosis is consistent with Increased chronic lower back pain with right-sided sciatica. Patient has a known diagnosis of low back pain with sciatica. Patient was lifting a heavy object when she felt increased pain to the region. There is no direct trauma to the area and no indication at this time for imaging. Patient does not have any concerning symptoms of bowel or bladder dysfunction, saddle anesthesia, paresthesias. Patient is given Toradol and muscle relaxer injection to the emergency department.. Patient will be discharged home with prescriptions for meloxicam and Flexeril. Patient is to follow up with orthopedics as needed or otherwise directed. Patient is given ED precautions to return to the ED for any worsening or new symptoms.     ____________________________________________  FINAL CLINICAL IMPRESSION(S) / ED DIAGNOSES  Final diagnoses:  Chronic midline low back pain with right-sided sciatica      NEW MEDICATIONS STARTED DURING  THIS VISIT:  New Prescriptions   CYCLOBENZAPRINE (FLEXERIL) 10 MG TABLET    Take 1 tablet (10 mg total) by mouth 3 (three) times daily as needed for muscle spasms.   MELOXICAM (MOBIC) 15 MG TABLET    Take 1 tablet (15 mg total) by mouth daily.        This chart was dictated using voice recognition software/Dragon. Despite best efforts to proofread, errors can occur which can change the meaning. Any change was purely unintentional.    Racheal PatchesJonathan D Mikie Misner, PA-C 01/27/16 1618    Emily FilbertJonathan E Williams, MD 01/27/16 2258

## 2016-01-27 NOTE — ED Notes (Signed)
Pt wanted to wait with her niece who is room 50. Pt given her discharge instructions and states understanding not to leave for the 20 minute waiting periods after having an im injection.

## 2016-01-27 NOTE — ED Triage Notes (Signed)
Reports lifting a 50lb bag of dog food 2 days ago, lower back pain since.  Ambulates well.

## 2016-02-19 ENCOUNTER — Emergency Department: Payer: Self-pay

## 2016-02-19 ENCOUNTER — Emergency Department
Admission: EM | Admit: 2016-02-19 | Discharge: 2016-02-19 | Disposition: A | Discharge status: Home / Self Care | Payer: Self-pay | Attending: Emergency Medicine | Admitting: Emergency Medicine

## 2016-02-19 ENCOUNTER — Encounter: Payer: Self-pay | Admitting: Emergency Medicine

## 2016-02-19 DIAGNOSIS — R109 Unspecified abdominal pain: Secondary | ICD-10-CM

## 2016-02-19 DIAGNOSIS — R102 Pelvic and perineal pain: Secondary | ICD-10-CM | POA: Insufficient documentation

## 2016-02-19 DIAGNOSIS — F17210 Nicotine dependence, cigarettes, uncomplicated: Secondary | ICD-10-CM | POA: Insufficient documentation

## 2016-02-19 DIAGNOSIS — Z79899 Other long term (current) drug therapy: Secondary | ICD-10-CM | POA: Insufficient documentation

## 2016-02-19 DIAGNOSIS — R1031 Right lower quadrant pain: Secondary | ICD-10-CM | POA: Insufficient documentation

## 2016-02-19 HISTORY — DX: Unspecified ovarian cyst, right side: N83.201

## 2016-02-19 HISTORY — DX: Unspecified ovarian cyst, left side: N83.202

## 2016-02-19 LAB — COMPREHENSIVE METABOLIC PANEL
ALBUMIN: 4.2 g/dL (ref 3.5–5.0)
ALK PHOS: 31 U/L — AB (ref 38–126)
ALT: 8 U/L — AB (ref 14–54)
ANION GAP: 5 (ref 5–15)
AST: 15 U/L (ref 15–41)
BUN: 17 mg/dL (ref 6–20)
CALCIUM: 9.2 mg/dL (ref 8.9–10.3)
CHLORIDE: 105 mmol/L (ref 101–111)
CO2: 26 mmol/L (ref 22–32)
Creatinine, Ser: 0.83 mg/dL (ref 0.44–1.00)
GFR calc Af Amer: >60 mL/min (ref >60)
GFR calc non Af Amer: >60 mL/min (ref >60)
GLUCOSE: 89 mg/dL (ref 65–99)
Potassium: 4.5 mmol/L (ref 3.5–5.1)
SODIUM: 136 mmol/L (ref 135–145)
Total Bilirubin: 0.8 mg/dL (ref 0.3–1.2)
Total Protein: 7.1 g/dL (ref 6.5–8.1)

## 2016-02-19 LAB — URINALYSIS, COMPLETE (UACMP) WITH MICROSCOPIC
BACTERIA UA: NONE SEEN
Bilirubin Urine: NEGATIVE
Glucose, UA: NEGATIVE mg/dL
Hgb urine dipstick: NEGATIVE
Ketones, ur: NEGATIVE mg/dL
Leukocytes, UA: NEGATIVE
Nitrite: NEGATIVE
PROTEIN: NEGATIVE mg/dL
SPECIFIC GRAVITY, URINE: 1.012 (ref 1.005–1.030)
pH: 6 (ref 5.0–8.0)

## 2016-02-19 LAB — CBC
HCT: 37.9 % (ref 35.0–47.0)
HEMOGLOBIN: 13.4 g/dL (ref 12.0–16.0)
MCH: 31.9 pg (ref 26.0–34.0)
MCHC: 35.5 g/dL (ref 32.0–36.0)
MCV: 89.9 fL (ref 80.0–100.0)
Platelets: 266 10*3/uL (ref 150–440)
RBC: 4.22 MIL/uL (ref 3.80–5.20)
RDW: 12.7 % (ref 11.5–14.5)
WBC: 9.9 10*3/uL (ref 3.6–11.0)

## 2016-02-19 LAB — POCT PREGNANCY, URINE: PREG TEST UR: NEGATIVE

## 2016-02-19 LAB — LIPASE, BLOOD: LIPASE: 30 U/L (ref 11–51)

## 2016-02-19 MED ORDER — HYDROCODONE-ACETAMINOPHEN 5-325 MG PO TABS
1.0000 | ORAL_TABLET | Freq: Once | ORAL | Status: AC
Start: 2016-02-19 — End: 2016-02-19
  Administered 2016-02-19: 1 via ORAL

## 2016-02-19 MED ORDER — KETOROLAC TROMETHAMINE 30 MG/ML IJ SOLN
INTRAMUSCULAR | Status: AC
  Filled 2016-02-19: qty 1

## 2016-02-19 MED ORDER — KETOROLAC TROMETHAMINE 30 MG/ML IJ SOLN
30.0000 mg | Freq: Once | INTRAMUSCULAR | Status: AC
  Administered 2016-02-19: 30 mg via INTRAMUSCULAR

## 2016-02-19 MED ORDER — HYDROCODONE-ACETAMINOPHEN 5-325 MG PO TABS
ORAL_TABLET | ORAL | Status: AC
  Administered 2016-02-19: 1 via ORAL
  Filled 2016-02-19: qty 1

## 2016-02-19 NOTE — ED Notes (Signed)
Pt a/o. Right lower abd pain 7/10. Pt states same as last ovarian cyst.

## 2016-02-19 NOTE — Discharge Instructions (Signed)
As we discussed it is important that you establish care with a primary care doctor. We recommend that you discuss with them an MRI for the soft tissue mass seen in your pelvis. Please seek medical attention for any high fevers, chest pain, shortness of breath, change in behavior, persistent vomiting, bloody stool or any other new or concerning symptoms.

## 2016-02-19 NOTE — ED Provider Notes (Signed)
CT renal stone   IMPRESSION:  Stable right iliac fossa soft tissue mass lesion of uncertain  etiology. There is been slight increase in size when compared with  the prior exam. Again MRI may be helpful for further  characterization.    Tiny nonobstructing left renal stone.    No other focal abnormality is seen.    Discussed ct scan finding with the patient. Discussed importance of following up with PCP to obtain MRI to evaluate soft tissue mass.    Phineas SemenGraydon Avan Gullett, MD 02/19/16 [phone]

## 2016-02-19 NOTE — ED Notes (Signed)
C/O pain.  Patient tearful.  Dr. Cyril LoosenKinner alerted and toradol ordered and administered per [facility].  Continue to monitor.

## 2016-02-19 NOTE — ED Triage Notes (Signed)
Lower R abd pain since yesterday. States has had appendectomy. History of ovarian cysts.

## 2016-02-19 NOTE — ED Provider Notes (Signed)
[facility] Emergency Department Provider Note   ____________________________________________    I have reviewed the triage vital signs and the nursing notes.   HISTORY  Chief Complaint Abdominal Pain     HPI Jennifer Dennis is a 35 y.o. female who presents with complaints of right lower quadrant pain. Patient reports this feels similar to when she has had an ovarian cyst in the past. She has had a left oophorectomy and she does not have a uterus. She denies fevers or chills. Reports normal stools. She has had an appendectomy in the past as well. No nausea or vomiting. She has not taken anything for the pain. She reports the pain is sharp and moderate to severe in nature. No vaginal discharge no dysuria.   Past Medical History:  Diagnosis Date  . Bilateral ovarian cysts     There are no active problems to display for this patient.   Past Surgical History:  Procedure Laterality Date  . APPENDECTOMY    . OOPHORECTOMY    . OVARIAN CYST REMOVAL      Prior to Admission medications   Medication Sig Start Date End Date Taking? Authorizing Provider  cyclobenzaprine (FLEXERIL) 10 MG tablet Take 1 tablet (10 mg total) by mouth 3 (three) times daily as needed for muscle spasms. 01/27/16   Delorise RoyalsJonathan D Cuthriell, PA-C  diazepam (VALIUM) 2 MG tablet Take 1 tablet (2 mg total) by mouth every 12 (twelve) hours as needed for muscle spasms. 12/26/15   Jenise V Bacon Menshew, PA-C  etodolac (LODINE) 200 MG capsule Take 1 capsule (200 mg total) by mouth every 8 (eight) hours. 11/13/15   Rebecka ApleyAllison P Webster, MD  ketorolac (TORADOL) 10 MG tablet Take 1 tablet (10 mg total) by mouth every 8 (eight) hours. 12/26/15   Jenise V Bacon Menshew, PA-C  lidocaine (LIDODERM) 5 % Place 1 patch onto the skin every 12 (twelve) hours. Remove & Discard patch within 12 hours or as directed by MD 11/13/15 11/12/16  Rebecka ApleyAllison P Webster, MD  meloxicam (MOBIC) 15 MG tablet Take 1 tablet (15  mg total) by mouth daily. 01/27/16   Delorise RoyalsJonathan D Cuthriell, PA-C  naproxen (NAPROSYN) 500 MG tablet Take 1 tablet (500 mg total) by mouth 2 (two) times daily with a meal. 11/07/15   Evangeline Dakinharles M Beers, PA-C  oxyCODONE-acetaminophen (ROXICET) 5-325 MG tablet Take 1-2 tablets by mouth every 4 (four) hours as needed for severe pain. 11/07/15   [name], PA-C  tiZANidine (ZANAFLEX) 4 MG capsule Take 1 capsule (4 mg total) by mouth 4 (four) times daily as needed for muscle spasms. 11/07/15   Evangeline Dakinharles M Beers, PA-C     Allergies Prednisone and Sulfur  No family history on file.  Social History Social History  Substance Use Topics  . Smoking status: Current Every Day Smoker    Packs/day: 1.00    Types: Cigarettes  . Smokeless tobacco: Never Used  . Alcohol use No    Review of Systems  Constitutional: No fever/chills Eyes: No visual changes.   Gastrointestinal: As above Genitourinary: Negative for dysuria. Musculoskeletal: Negative for back pain. Skin: Negative for rash. Neurological: Negative for headaches   10-point ROS otherwise negative.  ____________________________________________   PHYSICAL EXAM:  VITAL SIGNS: ED Triage Vitals  Enc Vitals Group     BP 02/19/16 0854 (!) 101/57     Pulse Rate 02/19/16 0854 95     Resp 02/19/16 0854 16     Temp 02/19/16  0854 98 F (36.7 C)     Temp Source 02/19/16 0854 Oral     SpO2 02/19/16 0854 100 %     Weight --      Height --      Head Circumference --      Peak Flow --      Pain Score 02/19/16 0859 10     Pain Loc --      Pain Edu? --      Excl. in GC? --     Constitutional: Alert and oriented. No acute distress. Pleasant and interactive Eyes: Conjunctivae are normal.   Nose: No congestion/rhinnorhea. Mouth/Throat: Mucous membranes are moist.    Cardiovascular: Normal rate, regular rhythm. Peri [place].  Good peripheral circulation. Respiratory: Normal respiratory effort.  No retractions.  Gastrointestinal: Soft and nontender. No  distention.  No CVA tenderness. Genitourinary: deferred Musculoskeletal: No lower extremity tenderness nor edema.  Warm and well perfused Neurologic:  Normal speech and language. No gross focal neurologic deficits are appreciated.  Skin:  Skin is warm, dry and intact. No rash noted. Psychiatric: Mood and affect are normal. Speech and behavior are normal.  ____________________________________________   LABS (all labs ordered are listed, but only abnormal results are displayed)  Labs Reviewed  COMPREHENSIVE METABOLIC PANEL - Abnormal; Notable for the following:       Result Value   ALT 8 (*)    Alkaline Phosphatase 31 (*)    All other components within normal limits  URINALYSIS, COMPLETE (UACMP) WITH MICROSCOPIC - Abnormal; Notable for the following:    Color, Urine YELLOW (*)    APPearance CLEAR (*)    Squamous Epithelial / LPF 0-5 (*)    All other components within normal limits  LIPASE, BLOOD  CBC  POC URINE PREG, ED  POCT PREGNANCY, URINE   ____________________________________________  EKG  None ____________________________________________  RADIOLOGY  Ultrasound unremarkable ____________________________________________   PROCEDURES  Procedure(s) performed: No    Critical Care performed: No ____________________________________________   INITIAL IMPRESSION / ASSESSMENT AND PLAN / ED COURSE  Pertinent labs & imaging results that were available during my care of the patient were reviewed by me and considered in my medical decision making (see chart for details).  Patient with right lower quadrant pain. She does not have an appendix. She does have a right ovary has a history of significant ovarian cysts, given that she is clinical able we will treat with IM Toradol and obtain ultrasound.  Clinical Course   Patient had mild improvement with Toradol. Ultrasound is unremarkable. Will check CT renal stone study in case this could be a kidney stone although it seems  unlikely given no hemoglobin in her urine. Will add by mouth Vicodin   Will ask Dr. Derrill KayGoodman to follow up on CT scan and dispo accordingly. Anticipate discharge   ____________________________________________   FINAL CLINICAL IMPRESSION(S) / ED DIAGNOSES  Final diagnoses:  Pelvic pain  Right flank pain      NEW MEDICATIONS STARTED DURING THIS VISIT:  New Prescriptions   No medications on file     Note:  This document was prepared using Dragon voice recognition software and may include unintentional dictation errors.    Jene Everyobert Arvin Abello, MD 02/19/16 1340

## 2016-03-30 ENCOUNTER — Encounter: Payer: Self-pay | Admitting: Emergency Medicine

## 2016-03-30 ENCOUNTER — Other Ambulatory Visit: Payer: Self-pay

## 2016-03-30 ENCOUNTER — Emergency Department
Admission: EM | Admit: 2016-03-30 | Discharge: 2016-03-31 | Disposition: A | Discharge status: Home / Self Care | Payer: Self-pay | Attending: Emergency Medicine | Admitting: Emergency Medicine

## 2016-03-30 ENCOUNTER — Emergency Department: Payer: Self-pay

## 2016-03-30 DIAGNOSIS — Z7289 Other problems related to lifestyle: Secondary | ICD-10-CM | POA: Insufficient documentation

## 2016-03-30 DIAGNOSIS — F17210 Nicotine dependence, cigarettes, uncomplicated: Secondary | ICD-10-CM | POA: Insufficient documentation

## 2016-03-30 DIAGNOSIS — R1013 Epigastric pain: Secondary | ICD-10-CM | POA: Insufficient documentation

## 2016-03-30 DIAGNOSIS — Z79899 Other long term (current) drug therapy: Secondary | ICD-10-CM | POA: Insufficient documentation

## 2016-03-30 DIAGNOSIS — Z765 Malingerer [conscious simulation]: Secondary | ICD-10-CM

## 2016-03-30 DIAGNOSIS — R112 Nausea with vomiting, unspecified: Secondary | ICD-10-CM | POA: Insufficient documentation

## 2016-03-30 LAB — HEPATIC FUNCTION PANEL
ALK PHOS: 30 U/L — AB (ref 38–126)
ALT: 9 U/L — AB (ref 14–54)
AST: 15 U/L (ref 15–41)
Albumin: 4.1 g/dL (ref 3.5–5.0)
Bilirubin, Direct: <0.1 mg/dL — ABNORMAL LOW (ref 0.1–0.5)
TOTAL PROTEIN: 7 g/dL (ref 6.5–8.1)
Total Bilirubin: 0.4 mg/dL (ref 0.3–1.2)

## 2016-03-30 LAB — BASIC METABOLIC PANEL
Anion gap: 6 (ref 5–15)
BUN: 17 mg/dL (ref 6–20)
CALCIUM: 8.9 mg/dL (ref 8.9–10.3)
CHLORIDE: 104 mmol/L (ref 101–111)
CO2: 29 mmol/L (ref 22–32)
CREATININE: 0.78 mg/dL (ref 0.44–1.00)
GFR calc non Af Amer: >60 mL/min (ref >60)
Glucose, Bld: 130 mg/dL — ABNORMAL HIGH (ref 65–99)
Potassium: 4.2 mmol/L (ref 3.5–5.1)
SODIUM: 139 mmol/L (ref 135–145)

## 2016-03-30 LAB — CBC
HCT: 38.1 % (ref 35.0–47.0)
Hemoglobin: 13.1 g/dL (ref 12.0–16.0)
MCH: 31.2 pg (ref 26.0–34.0)
MCHC: 34.3 g/dL (ref 32.0–36.0)
MCV: 91.1 fL (ref 80.0–100.0)
PLATELETS: 255 10*3/uL (ref 150–440)
RBC: 4.18 MIL/uL (ref 3.80–5.20)
RDW: 12.7 % (ref 11.5–14.5)
WBC: 10 10*3/uL (ref 3.6–11.0)

## 2016-03-30 LAB — LIPASE, BLOOD: Lipase: 45 U/L (ref 11–51)

## 2016-03-30 LAB — TROPONIN I

## 2016-03-30 MED ORDER — GI COCKTAIL ~~LOC~~
30.0000 mL | Freq: Once | ORAL | Status: AC
  Administered 2016-03-30: 30 mL via ORAL
  Filled 2016-03-30: qty 30

## 2016-03-30 NOTE — ED Triage Notes (Signed)
Pt states that she has had chest pain since approximately 0600 this am. Pt states that her pain is mid sternum and under her breasts. Pt reports that she has been experiencing nausea and x1 episode of vomiting. Pt also reports that she has family hx of gallstones and is concerned that this could be her gallbladder. Pt is ambulatory to triage at this time with NAD noted.

## 2016-03-30 NOTE — ED Provider Notes (Addendum)
[facility] Emergency Department Provider Note  ____________________________________________   I have reviewed the triage vital signs and the nursing notes.   HISTORY  Chief Complaint Chest Pain    HPI Jennifer Dennis is a 36 y.o. female states she vomited once this morning since that time as had epigastric abdominal discomfort. Denies any chest pain shows breath. No ongoing vomiting. No diarrhea. Patient states that she has not had a fever or chills. She denies any hematemesis, melena or bright red blood per rectum. She states that she is mildly nauseated this time. The pain has been uninterrupted since 6:00 this morning. She describes as a sharp pain nothing makes it better nothing makes it worse does not radiate.    Past Medical History:  Diagnosis Date  . Bilateral ovarian cysts     There are no active problems to display for this patient.   Past Surgical History:  Procedure Laterality Date  . APPENDECTOMY    . OOPHORECTOMY    . OVARIAN CYST REMOVAL      Prior to Admission medications   Medication Sig Start Date End Date Taking? Authorizing Provider  cyclobenzaprine (FLEXERIL) 10 MG tablet Take 1 tablet (10 mg total) by mouth 3 (three) times daily as needed for muscle spasms. 01/27/16   Delorise Royals Cuthriell, PA-C  diazepam (VALIUM) 2 MG tablet Take 1 tablet (2 mg total) by mouth every 12 (twelve) hours as needed for muscle spasms. 12/26/15   Jenise V Bacon Menshew, PA-C  etodolac (LODINE) 200 MG capsule Take 1 capsule (200 mg total) by mouth every 8 (eight) hours. 11/13/15   Rebecka Apley, MD  ketorolac (TORADOL) 10 MG tablet Take 1 tablet (10 mg total) by mouth every 8 (eight) hours. 12/26/15   Jenise V Bacon Menshew, PA-C  lidocaine (LIDODERM) 5 % Place 1 patch onto the skin every 12 (twelve) hours. Remove & Discard patch within 12 hours or as directed by MD 11/13/15 11/12/16  Rebecka Apley, MD  meloxicam (MOBIC) 15 MG tablet Take 1 tablet  (15 mg total) by mouth daily. 01/27/16   Delorise Royals Cuthriell, PA-C  naproxen (NAPROSYN) 500 MG tablet Take 1 tablet (500 mg total) by mouth 2 (two) times daily with a meal. 11/07/15   Evangeline Dakin, PA-C  oxyCODONE-acetaminophen (ROXICET) 5-325 MG tablet Take 1-2 tablets by mouth every 4 (four) hours as needed for severe pain. 11/07/15   Charmayne Sheer Beers, PA-C  tiZANidine (ZANAFLEX) 4 MG capsule Take 1 capsule (4 mg total) by mouth 4 (four) times daily as needed for muscle spasms. 11/07/15   Evangeline Dakin, PA-C    Allergies Prednisone and Sulfur  No family history on file.  Social History Social History  Substance Use Topics  . Smoking status: Current Every Day Smoker    Packs/day: 1.00    Types: Cigarettes  . Smokeless tobacco: Never Used  . Alcohol use No    Review of Systems Constitutional: No fever/chills Eyes: No visual changes. ENT: No sore throat. No stiff neck no neck pain Cardiovascular: Denies chest pain. Respiratory: Denies shortness of breath. Gastrointestinal:   Nausea with vomiting.  No diarrhea.  No constipation. Genitourinary: Negative for dysuria. Musculoskeletal: Negative lower extremity swelling Skin: Negative for rash. Neurological: Negative for severe headaches, focal weakness or numbness. 10-point ROS otherwise negative.  ____________________________________________   PHYSICAL EXAM:  VITAL SIGNS: ED Triage Vitals  Enc Vitals Group     BP 03/30/16 2143 105/68  Pulse Rate 03/30/16 2143 78     Resp 03/30/16 2143 20     Temp 03/30/16 2143 98 F (36.7 C)     Temp Source 03/30/16 2143 Oral     SpO2 03/30/16 2143 100 %     Weight 03/30/16 2141 125 lb (56.7 kg)     Height 03/30/16 2141 5' (1.524 m)     Head Circumference --      Peak Flow --      Pain Score 03/30/16 2141 8     Pain Loc --      Pain Edu? --      Excl. in GC? --     Constitutional: Alert and oriented. Well appearing and in no acute distress. Eyes: Conjunctivae are normal.  PERRL. EOMI. Head: Atraumatic. Nose: No congestion/rhinnorhea. Mouth/Throat: Mucous membranes are moist.  Oropharynx non-erythematous. Neck: No stridor.   Nontender with no meningismus Cardiovascular: Normal rate, regular rhythm. Grossly normal heart sounds.  Good peripheral circulation. Respiratory: Normal respiratory effort.  No retractions. Lungs CTAB. Abdominal: Soft and Minimally tender epigastric abdominal discomfort with no guarding or rebound, this reproduces her pain. No distention. No guarding no rebound Back:  There is no focal tenderness or step off.  there is no midline tenderness there are no lesions noted. there is no CVA tenderness Musculoskeletal: No lower extremity tenderness, no upper extremity tenderness. No joint effusions, no DVT signs strong distal pulses no edema Neurologic:  Normal speech and language. No gross focal neurologic deficits are appreciated.  Skin:  Skin is warm, dry and intact. No rash noted. Psychiatric: Mood and affect are normal. Speech and behavior are normal.  ____________________________________________   LABS (all labs ordered are listed, but only abnormal results are displayed)  Labs Reviewed  BASIC METABOLIC PANEL - Abnormal; Notable for the following:       Result Value   Glucose, Bld 130 (*)    All other components within normal limits  HEPATIC FUNCTION PANEL - Abnormal; Notable for the following:    ALT 9 (*)    Alkaline Phosphatase 30 (*)    Bilirubin, Direct <0.1 (*)    All other components within normal limits  CBC  TROPONIN I  LIPASE, BLOOD  URINE DRUG SCREEN, QUALITATIVE ([facility] ONLY)   ____________________________________________  EKG  I personally interpreted any EKGs ordered by me or triage Normal sinus rhythm rate 77 bpm no acute ST elevation or acute ST depression normal axis unremarkable EKG ____________________________________________  RADIOLOGY  I reviewed any imaging ordered by me or triage that were performed  during my shift and, if possible, patient and/or family made aware of any abnormal findings. ____________________________________________   PROCEDURES  Procedure(s) performed: None  Procedures  Critical Care performed: None  ____________________________________________   INITIAL IMPRESSION / ASSESSMENT AND PLAN / ED COURSE  Pertinent labs & imaging results that were available during my care of the patient were reviewed by me and considered in my medical decision making (see chart for details).  Ration has been here multiple times with chronic pain complaints in the last year, this is her eighth pain related complaint the last year. At this time she has no lower pelvic pain to suggest PID or appendicitis, she has a recent CT scan which is within normal gallbladder with no stones that she has normal liver function tests at this time she has no focal right upper quadrant pain and I do not believe she likely has gallbladder disease versus possible for her epigastric abdominal  discomfort. This is most likely a stridorous. Low suspicion for ACS PE or dissection very reproducible pain, troponin is negative EKG reassuring despite pain since this morning. We will see if we can give her antiacids. I don't think narcotic pain medications are indicated at this time.  ----------------------------------------- 12:59 AM on 03/31/2016 -----------------------------------------  Multiple different nurses reported the patient and her partner were upset that I was not giving her narcotic pain medication. Urine drug screen does show positive for opiates. I did specifically ask her she was taking any opiates or any narcotic pain medication and she denied it. I'm concerned about the possibility of drug-seeking behavior in this patient. I'm very reassured by all of her other findings. We will discharge the patient with antacids for her epigastric abdominal pain and return precautions and follow-up have been given  and understood.    In mention to the patient that despite being told she was not taking any pain medications, she was positive in her urine. Patient states "I had a leftover pill from before that I took this morning". This is different from what she told me however initially. Her significant other in the room was very angry that I'm not giving them narcotics. I have explained unfortunately I'm unable to do so patient in no acute distress with no significant pathology noted. She declines further workup and is this time threatening to walk out. We will send her home with antiacid return precautions and follow-up given and understood she understands she has any decompensation from medical point of view she can certainly return to the emergency room. He does not wish counseling for narcotic abuse at this time. ____________________________________________   FINAL CLINICAL IMPRESSION(S) / ED DIAGNOSES  Final diagnoses:  None      This chart was dictated using voice recognition software.  Despite best efforts to proofread,  errors can occur which can change meaning.      Jeanmarie PlantJames A Priseis Cratty, MD 03/30/16 [phone]    Jeanmarie PlantJames A Mylissa Lambe, MD 03/31/16 0100    Jeanmarie PlantJames A Shaquanna Lycan, MD 03/31/16 [phone]

## 2016-03-31 LAB — URINALYSIS, COMPLETE (UACMP) WITH MICROSCOPIC
BACTERIA UA: NONE SEEN
BILIRUBIN URINE: NEGATIVE
Glucose, UA: NEGATIVE mg/dL
Hgb urine dipstick: NEGATIVE
Ketones, ur: NEGATIVE mg/dL
Leukocytes, UA: NEGATIVE
Nitrite: NEGATIVE
Protein, ur: NEGATIVE mg/dL
SPECIFIC GRAVITY, URINE: 1.008 (ref 1.005–1.030)
pH: 7 (ref 5.0–8.0)

## 2016-03-31 LAB — URINE DRUG SCREEN, QUALITATIVE (ARMC ONLY)
Amphetamines, Ur Screen: NOT DETECTED
Barbiturates, Ur Screen: NOT DETECTED
Benzodiazepine, Ur Scrn: NOT DETECTED
CANNABINOID 50 NG, UR ~~LOC~~: NOT DETECTED
COCAINE METABOLITE, UR ~~LOC~~: NOT DETECTED
MDMA (ECSTASY) UR SCREEN: NOT DETECTED
Methadone Scn, Ur: NOT DETECTED
OPIATE, UR SCREEN: POSITIVE — AB
PHENCYCLIDINE (PCP) UR S: NOT DETECTED
Tricyclic, Ur Screen: NOT DETECTED

## 2016-03-31 LAB — POCT PREGNANCY, URINE: PREG TEST UR: NEGATIVE

## 2016-03-31 MED ORDER — FAMOTIDINE 20 MG PO TABS: 20.0000 mg | ORAL_TABLET | Freq: Every day | ORAL | 1 refills | Status: AC

## 2016-08-19 ENCOUNTER — Encounter: Payer: Self-pay | Admitting: Emergency Medicine

## 2016-08-19 ENCOUNTER — Emergency Department
Admission: EM | Admit: 2016-08-19 | Discharge: 2016-08-19 | Disposition: A | Discharge status: Home / Self Care | Payer: No Typology Code available for payment source | Attending: Emergency Medicine | Admitting: Emergency Medicine

## 2016-08-19 ENCOUNTER — Emergency Department: Payer: No Typology Code available for payment source

## 2016-08-19 DIAGNOSIS — M7918 Myalgia, other site: Secondary | ICD-10-CM

## 2016-08-19 DIAGNOSIS — F17210 Nicotine dependence, cigarettes, uncomplicated: Secondary | ICD-10-CM | POA: Insufficient documentation

## 2016-08-19 DIAGNOSIS — M79645 Pain in left finger(s): Secondary | ICD-10-CM

## 2016-08-19 DIAGNOSIS — Z79899 Other long term (current) drug therapy: Secondary | ICD-10-CM | POA: Diagnosis not present

## 2016-08-19 DIAGNOSIS — S63615A Unspecified sprain of left ring finger, initial encounter: Secondary | ICD-10-CM | POA: Insufficient documentation

## 2016-08-19 DIAGNOSIS — Y929 Unspecified place or not applicable: Secondary | ICD-10-CM | POA: Diagnosis not present

## 2016-08-19 DIAGNOSIS — S6992XA Unspecified injury of left wrist, hand and finger(s), initial encounter: Secondary | ICD-10-CM | POA: Diagnosis present

## 2016-08-19 DIAGNOSIS — Y939 Activity, unspecified: Secondary | ICD-10-CM | POA: Diagnosis not present

## 2016-08-19 DIAGNOSIS — Y999 Unspecified external cause status: Secondary | ICD-10-CM | POA: Insufficient documentation

## 2016-08-19 DIAGNOSIS — M791 Myalgia: Secondary | ICD-10-CM | POA: Diagnosis not present

## 2016-08-19 MED ORDER — IBUPROFEN 600 MG PO TABS: 600.0000 mg | ORAL_TABLET | Freq: Three times a day (TID) | ORAL | 0 refills | Status: DC | PRN

## 2016-08-19 MED ORDER — BACITRACIN ZINC 500 UNIT/GM EX OINT
TOPICAL_OINTMENT | Freq: Two times a day (BID) | CUTANEOUS | Status: DC
  Filled 2016-08-19: qty 0.9

## 2016-08-19 MED ORDER — CYCLOBENZAPRINE HCL 10 MG PO TABS: 10.0000 mg | ORAL_TABLET | Freq: Three times a day (TID) | ORAL | 0 refills | Status: DC | PRN

## 2016-08-19 MED ORDER — TRAMADOL HCL 50 MG PO TABS: 50.0000 mg | ORAL_TABLET | Freq: Four times a day (QID) | ORAL | 0 refills | Status: DC | PRN
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
## 2016-12-11 ENCOUNTER — Emergency Department
Admission: EM | Admit: 2016-12-11 | Discharge: 2016-12-11 | Disposition: A | Discharge status: Home / Self Care | Payer: Self-pay | Attending: Emergency Medicine | Admitting: Emergency Medicine

## 2016-12-11 ENCOUNTER — Emergency Department: Payer: Self-pay

## 2016-12-11 DIAGNOSIS — Z79899 Other long term (current) drug therapy: Secondary | ICD-10-CM | POA: Insufficient documentation

## 2016-12-11 DIAGNOSIS — M25532 Pain in left wrist: Secondary | ICD-10-CM | POA: Insufficient documentation

## 2016-12-11 DIAGNOSIS — W57XXXA Bitten or stung by nonvenomous insect and other nonvenomous arthropods, initial encounter: Secondary | ICD-10-CM | POA: Insufficient documentation

## 2016-12-11 DIAGNOSIS — F17210 Nicotine dependence, cigarettes, uncomplicated: Secondary | ICD-10-CM | POA: Insufficient documentation

## 2016-12-11 DIAGNOSIS — W231XXA Caught, crushed, jammed, or pinched between stationary objects, initial encounter: Secondary | ICD-10-CM | POA: Insufficient documentation

## 2016-12-11 MED ORDER — CEPHALEXIN 500 MG PO CAPS: 500.0000 mg | ORAL_CAPSULE | Freq: Four times a day (QID) | ORAL | 0 refills | Status: DC

## 2016-12-11 MED ORDER — NAPROXEN 500 MG PO TABS
500.0000 mg | ORAL_TABLET | Freq: Once | ORAL | Status: AC
  Administered 2016-12-11: 500 mg via ORAL
  Filled 2016-12-11: qty 1

## 2016-12-11 MED ORDER — CEPHALEXIN 500 MG PO CAPS
500.0000 mg | ORAL_CAPSULE | Freq: Once | ORAL | Status: AC
  Administered 2016-12-11: 500 mg via ORAL
  Filled 2016-12-11: qty 1

## 2016-12-11 MED ORDER — NAPROXEN 500 MG PO TABS: 500.0000 mg | ORAL_TABLET | Freq: Two times a day (BID) | ORAL | 0 refills | Status: DC
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
## 2016-12-11 NOTE — ED Provider Notes (Signed)
[facility] Emergency Department Provider Note   ____________________________________________   First MD Initiated Contact with Patient 12/11/16 1451     (approximate)  I have reviewed the triage vital signs and the nursing notes.   HISTORY  Chief Complaint Wrist Injury    HPI Jennifer Dennis is a 36 y.o. female complaining of left wrist pain secondary to a contusion at work yesterday. States the wrist was caught in her freezer door. Patient also has a small insect bite to the left wrist and she is unsure if this is the cause of her pain. Patient rates the pain as a 9/10. Patient described a pain as aching/throbbing/tender. No palliative measures for complaint.   Past Medical History:  Diagnosis Date  . Bilateral ovarian cysts     There are no active problems to display for this patient.   Past Surgical History:  Procedure Laterality Date  . APPENDECTOMY    . OOPHORECTOMY    . OVARIAN CYST REMOVAL      Prior to Admission medications   Medication Sig Start Date End Date Taking? Authorizing Provider  cephALEXin (KEFLEX) 500 MG capsule Take 1 capsule (500 mg total) by mouth 4 (four) times daily. 12/11/16 12/21/16  Joni Reining, PA-C  cyclobenzaprine (FLEXERIL) 10 MG tablet Take 1 tablet (10 mg total) by mouth 3 (three) times daily as needed for muscle spasms. 01/27/16   Cuthriell, Delorise Royals, PA-C  cyclobenzaprine (FLEXERIL) 10 MG tablet Take 1 tablet (10 mg total) by mouth 3 (three) times daily as needed. 08/19/16   Joni Reining, PA-C  diazepam (VALIUM) 2 MG tablet Take 1 tablet (2 mg total) by mouth every 12 (twelve) hours as needed for muscle spasms. 12/26/15   Menshew, Charlesetta Ivory, PA-C  etodolac (LODINE) 200 MG capsule Take 1 capsule (200 mg total) by mouth every 8 (eight) hours. 11/13/15   Rebecka Apley, MD  famotidine (PEPCID) 20 MG tablet Take 1 tablet (20 mg total) by mouth daily. 03/31/16 03/31/17  Jeanmarie Plant, MD    ibuprofen (ADVIL,MOTRIN) 600 MG tablet Take 1 tablet (600 mg total) by mouth every 8 (eight) hours as needed. 08/19/16   Joni Reining, PA-C  ketorolac (TORADOL) 10 MG tablet Take 1 tablet (10 mg total) by mouth every 8 (eight) hours. 12/26/15   Menshew, Charlesetta Ivory, PA-C  meloxicam (MOBIC) 15 MG tablet Take 1 tablet (15 mg total) by mouth daily. 01/27/16   Cuthriell, Delorise Royals, PA-C  naproxen (NAPROSYN) 500 MG tablet Take 1 tablet (500 mg total) by mouth 2 (two) times daily with a meal. 11/07/15   Beers, Charmayne Sheer, PA-C  naproxen (NAPROSYN) 500 MG tablet Take 1 tablet (500 mg total) by mouth 2 (two) times daily with a meal. 12/11/16   Joni Reining, PA-C  oxyCODONE-acetaminophen (ROXICET) 5-325 MG tablet Take 1-2 tablets by mouth every 4 (four) hours as needed for severe pain. 11/07/15   Beers, Charmayne Sheer, PA-C  tiZANidine (ZANAFLEX) 4 MG capsule Take 1 capsule (4 mg total) by mouth 4 (four) times daily as needed for muscle spasms. 11/07/15   Beers, Charmayne Sheer, PA-C  traMADol (ULTRAM) 50 MG tablet Take 1 tablet (50 mg total) by mouth every 6 (six) hours as needed for moderate pain. 08/19/16   Joni Reining, PA-C    Allergies Prednisone; Sulfur; and Tramadol  No family history on file.  Social History Social History  Substance Use Topics  . Smoking status: Current  Every Day Smoker    Packs/day: 1.00    Types: Cigarettes  . Smokeless tobacco: Never Used  . Alcohol use No    Review of Systems Constitutional: No fever/chills Eyes: No visual changes. ENT: No sore throat. Cardiovascular: Denies chest pain. Respiratory: Denies shortness of breath. Gastrointestinal: No abdominal pain.  No nausea, no vomiting.  No diarrhea.  No constipation. Genitourinary: Negative for dysuria. Musculoskeletal: Negative for back pain. Skin: Negative for rash. Mild redness and swelling dose aspect of left wrist.  Neurological: Negative for headaches, focal weakness or numbness. Allergic/Immunilogical:  See medication list ____________________________________________   PHYSICAL EXAM:  VITAL SIGNS: ED Triage Vitals [12/11/16 1420]  Enc Vitals Group     BP 117/66     Pulse Rate 80     Resp 16     Temp 98 F (36.7 C)     Temp Source Oral     SpO2 100 %     Weight 120 lb (54.4 kg)     Height 5' (1.524 m)     Head Circumference      Peak Flow      Pain Score 9     Pain Loc      Pain Edu?      Excl. in GC?     Constitutional: Alert and oriented. Well appearing and in no acute distress. Cardiovascular: Normal rate, regular rhythm. Grossly normal heart sounds.  Good peripheral circulation. Respiratory: Normal respiratory effort.  No retractions. Lungs CTAB. Gastrointestinal: Soft and nontender. No distention. No abdominal bruits. No CVA tenderness. Musculoskeletal: No lower extremity tenderness nor edema.  No joint effusions. Neurologic:  Normal speech and language. No gross focal neurologic deficits are appreciated. No gait instability. Skin:  Skin is warm, dry and intact. No rash noted. Psychiatric: Mood and affect are normal. Speech and behavior are normal.  ____________________________________________   LABS (all labs ordered are listed, but only abnormal results are displayed)  Labs Reviewed - No data to display ____________________________________________  EKG   ____________________________________________  RADIOLOGY  Dg Wrist Complete Left  Result Date: 12/11/2016 CLINICAL DATA:  Direct trauma to the left wrist when a piece of furniture fell on it. The patient complains of posterior pain. EXAM: LEFT WRIST - COMPLETE 3+ VIEW COMPARISON:  Left index finger which included a small portion of the wrist dated August 19, 2016 FINDINGS: The bones of the wrist are subjectively adequately mineralized. There is no acute fracture nor dislocation. The joint spaces are reasonably well-maintained. The soft tissues exhibit no acute abnormalities. IMPRESSION: There is no acute or  significant chronic bony abnormality of the left wrist. Electronically Signed   By: David  [place] M.D.   On: 12/11/2016 15:35    ____________________________________________   PROCEDURES  Procedure(s) performed: None  Procedures  Critical Care performed: No  ____________________________________________   INITIAL IMPRESSION / ASSESSMENT AND PLAN / ED COURSE  As part of my medical decision making, I reviewed the following data within the electronic MEDICAL RECORD NUMBER Patient signed out to , Notes from prior ED visits and [place] Controlled Substance Database   Left wrist pain secondary to contusion insect bite. Discussed negative x-ray finding with patient. Patient placed in a wrist splint and given a prescription for naproxen and Keflex. Patient [facility]. Patient given a work note for today.      ____________________________________________   FINAL CLINICAL IMPRESSION(S) / ED DIAGNOSES  Final diagnoses:  Pain in joint of left  wrist  Insect bite, initial encounter      NEW MEDICATIONS STARTED DURING THIS VISIT:  New Prescriptions   CEPHALEXIN (KEFLEX) 500 MG CAPSULE    Take 1 capsule (500 mg total) by mouth 4 (four) times daily.   NAPROXEN (NAPROSYN) 500 MG TABLET    Take 1 tablet (500 mg total) by mouth 2 (two) times daily with a meal.     Note:  This document was prepared using Dragon voice recognition software and may include unintentional dictation errors.    Joni Reining, PA-C 12/11/16 1548    Arnaldo Natal, MD 12/11/16 [phone]

## 2016-12-21 ENCOUNTER — Emergency Department
Admission: EM | Admit: 2016-12-21 | Discharge: 2016-12-21 | Disposition: A | Discharge status: Home / Self Care | Payer: Self-pay | Attending: Emergency Medicine | Admitting: Emergency Medicine

## 2016-12-21 ENCOUNTER — Emergency Department: Payer: Self-pay

## 2016-12-21 DIAGNOSIS — Z79899 Other long term (current) drug therapy: Secondary | ICD-10-CM | POA: Insufficient documentation

## 2016-12-21 DIAGNOSIS — F17210 Nicotine dependence, cigarettes, uncomplicated: Secondary | ICD-10-CM | POA: Insufficient documentation

## 2016-12-21 DIAGNOSIS — L03113 Cellulitis of right upper limb: Secondary | ICD-10-CM | POA: Insufficient documentation

## 2016-12-21 DIAGNOSIS — L03119 Cellulitis of unspecified part of limb: Secondary | ICD-10-CM

## 2016-12-21 LAB — CBC WITH DIFFERENTIAL/PLATELET
Basophils Absolute: 0.1 10*3/uL (ref 0–0.1)
Basophils Relative: 1 %
EOS ABS: 0.2 10*3/uL (ref 0–0.7)
EOS PCT: 2 %
HCT: 35.1 % (ref 35.0–47.0)
Hemoglobin: 12.2 g/dL (ref 12.0–16.0)
Lymphocytes Relative: 21 %
Lymphs Abs: 2.7 10*3/uL (ref 1.0–3.6)
MCH: 30.9 pg (ref 26.0–34.0)
MCHC: 34.8 g/dL (ref 32.0–36.0)
MCV: 88.7 fL (ref 80.0–100.0)
MONO ABS: 0.9 10*3/uL (ref 0.2–0.9)
Monocytes Relative: 7 %
Neutro Abs: 9.1 10*3/uL — ABNORMAL HIGH (ref 1.4–6.5)
Neutrophils Relative %: 69 %
Platelets: 270 10*3/uL (ref 150–440)
RBC: 3.96 MIL/uL (ref 3.80–5.20)
RDW: 13.5 % (ref 11.5–14.5)
WBC: 13 10*3/uL — AB (ref 3.6–11.0)

## 2016-12-21 LAB — BASIC METABOLIC PANEL
ANION GAP: 7 (ref 5–15)
BUN: 11 mg/dL (ref 6–20)
CALCIUM: 8.9 mg/dL (ref 8.9–10.3)
CO2: 25 mmol/L (ref 22–32)
CREATININE: 0.81 mg/dL (ref 0.44–1.00)
Chloride: 105 mmol/L (ref 101–111)
GLUCOSE: 79 mg/dL (ref 65–99)
Potassium: 3.6 mmol/L (ref 3.5–5.1)
Sodium: 137 mmol/L (ref 135–145)

## 2016-12-21 MED ORDER — KETOROLAC TROMETHAMINE 10 MG PO TABS: 10.0000 mg | ORAL_TABLET | Freq: Three times a day (TID) | ORAL | 0 refills | Status: AC

## 2016-12-21 MED ORDER — KETOROLAC TROMETHAMINE 30 MG/ML IJ SOLN
30.0000 mg | Freq: Once | INTRAMUSCULAR | Status: AC
  Administered 2016-12-21: 30 mg via INTRAVENOUS
  Filled 2016-12-21: qty 1

## 2016-12-21 MED ORDER — CLINDAMYCIN PHOSPHATE 600 MG/50ML IV SOLN
600.0000 mg | Freq: Once | INTRAVENOUS | Status: AC
  Administered 2016-12-21: 600 mg via INTRAVENOUS
  Filled 2016-12-21: qty 50

## 2016-12-21 MED ORDER — BACITRACIN ZINC 500 UNIT/GM EX OINT
TOPICAL_OINTMENT | CUTANEOUS | Status: AC
  Filled 2016-12-21: qty 0.9

## 2016-12-21 MED ORDER — CLINDAMYCIN HCL 300 MG PO CAPS: 300.0000 mg | ORAL_CAPSULE | Freq: Four times a day (QID) | ORAL | 0 refills | Status: AC

## 2016-12-21 NOTE — ED Triage Notes (Signed)
Pt reports right hand swelling x1 week, pt unsure if she got bit by something, trained to cut open swollen area to see if anything would drain last night. Afebrile, pt not tachycardic. All vs stable.

## 2016-12-21 NOTE — ED Notes (Signed)
Pt discharged to home.  Family member driving.  Discharge instructions reviewed.  Verbalized understanding.  No questions or concerns at this time.  Teach back verified.  Pt in NAD.  No items left in ED.   

## 2016-12-21 NOTE — ED Provider Notes (Signed)
[facility] Emergency Department Provider Note ____________________________________________  Time seen: 1741  I have reviewed the triage vital signs and the nursing notes.  HISTORY  Chief Complaint  Arm Pain  HPI Jennifer Dennis is a 36 y.o. female presents to the ED for a 1 week complaint of dorsal right hand swelling. She admits to attempting to "cut" the wound open last night. She claims she was bitten by something. She denies any fevers, chills, or nausea.   Past Medical History:  Diagnosis Date  . Bilateral ovarian cysts     There are no active problems to display for this patient.   Past Surgical History:  Procedure Laterality Date  . APPENDECTOMY    . OOPHORECTOMY    . OVARIAN CYST REMOVAL      Prior to Admission medications   Medication Sig Start Date End Date Taking? Authorizing Provider  clindamycin (CLEOCIN) 300 MG capsule Take 1 capsule (300 mg total) by mouth 4 (four) times daily. 12/21/16 12/31/16  Janoah Menna, Charlesetta IvoryJenise V Bacon, PA-C  famotidine (PEPCID) 20 MG tablet Take 1 tablet (20 mg total) by mouth daily. 03/31/16 03/31/17  Jeanmarie PlantMcShane, James A, MD  ketorolac (TORADOL) 10 MG tablet Take 1 tablet (10 mg total) by mouth every 8 (eight) hours. 12/21/16   Halee Glynn, Charlesetta IvoryJenise V Bacon, PA-C    Allergies Prednisone; Sulfur; and Tramadol  No family history on file.  Social History Social History  Substance Use Topics  . Smoking status: Current Every Day Smoker    Packs/day: 1.00    Types: Cigarettes  . Smokeless tobacco: Never Used  . Alcohol use No    Review of Systems  Constitutional: Negative for fever. Cardiovascular: Negative for chest pain. Respiratory: Negative for shortness of breath. Gastrointestinal: Negative for abdominal pain, vomiting and diarrhea. Genitourinary: Negative for dysuria. Musculoskeletal: Negative for back pain. Skin: Negative for rash. Left hand swelling and infection as above.  Neurological: Negative for  headaches, focal weakness or numbness. ____________________________________________  PHYSICAL EXAM:  VITAL SIGNS: ED Triage Vitals  Enc Vitals Group     BP 12/21/16 1655 (!) 111/56     Pulse Rate 12/21/16 1655 76     Resp 12/21/16 1655 16     Temp 12/21/16 1655 98.7 F (37.1 C)     Temp Source 12/21/16 1655 Oral     SpO2 12/21/16 1655 100 %     Weight 12/21/16 1656 120 lb (54.4 kg)     Height 12/21/16 1656 5' (1.524 m)     Head Circumference --      Peak Flow --      Pain Score --      Pain Loc --      Pain Edu? --      Excl. in GC? --     Constitutional: Alert and oriented. Well appearing and in no distress. Head: Normocephalic and atraumatic. Eyes: Conjunctivae are normal. Normal extraocular movements Hematological/Lymphatic/Immunological: No epitrochlear lymphadenopathy. Cardiovascular: Normal rate, regular rhythm. Normal distal pulses. Respiratory: Normal respiratory effort. No wheezes/rales/rhonchi. Gastrointestinal: Soft and nontender. No distention. Musculoskeletal: Gross dorsal STS noted to the right hand. Focal erythematous, indurated, cellulitic lesion without fluctuance noted. NO spontaneous drainage noted. There is some extension of the swelling to the dorsal wrist. Nontender with normal range of motion in all extremities.  Neurologic:  Normal gross sensation. Normal speech and language. No gross focal neurologic deficits are appreciated. Skin:  Skin is warm, dry and intact. No rash noted. No streaking, lymphangitis, or blister  formation noted.  ____________________________________________   LABS (pertinent positives/negatives)  Labs Reviewed  CBC WITH DIFFERENTIAL/PLATELET - Abnormal; Notable for the following:       Result Value   WBC 13.0 (*)    Neutro Abs 9.1 (*)    All other components within normal limits  BASIC METABOLIC PANEL  ____________________________________________   RADIOLOGY  Right Hand IMPRESSION: Generalized soft tissue swelling  without acute bony abnormality.  Questionable foreign body in the proximal fourth digit as described.  I, Mykelle Cockerell, Charlesetta Ivory, personally viewed and evaluated these images (plain radiographs) as part of my medical decision making, as well as reviewing the written report by the radiologist. ____________________________________________  PROCEDURES  Clindamycin 600 mg IVPB Toradol 30 mg IVP ____________________________________________  INITIAL IMPRESSION / ASSESSMENT AND PLAN / ED COURSE  Patient with ED evaluation of a focal cellulitis to the dorsum of the right hand. Patient's labs show a mild leukocytosis at 13 K/uL with a left shift. She is afebrile, and was able vital signs during her course in the ED. Her x-ray does not show any focal bony erosion or suspicion for a cutaneous abscess. She is treated empirically with the IV dose of clindamycin and will be discharged with the same for oral dosing. Cellulitis management instructions are provided and return precautions are reviewed. She may return to the ED as needed for worsening symptoms as discussed. ____________________________________________  FINAL CLINICAL IMPRESSION(S) / ED DIAGNOSES  Final diagnoses:  Cellulitis of hand      Lissa Hoard, PA-C 12/22/16 0026    Nita Sickle, MD 12/25/16 [phone]

## 2016-12-21 NOTE — Discharge Instructions (Signed)
Your are being treated for a skin infection, likely caused by staph. Take the antibiotic as directed, and the pain medicine as directed. Keep the wound clean, dry, and covered. Apply ice to reduce swelling. Return to the ED for signs of worsening infection, swelling, or fevers, as discussed.

## 2017-02-01 DEATH — deceased

## 2018-12-04 IMAGING — DX DG WRIST COMPLETE 3+V*L*
4 series · 4 of 4 positions shown · non-contrast
Comparison: Left index finger which included a small portion of the
wrist dated August 19, 2016

CLINICAL DATA: Direct trauma to the left wrist when a piece of
furniture fell on it. The patient complains of posterior pain.

EXAM:
LEFT WRIST - COMPLETE 3+ VIEW

[wrist ap (1 of 2)]
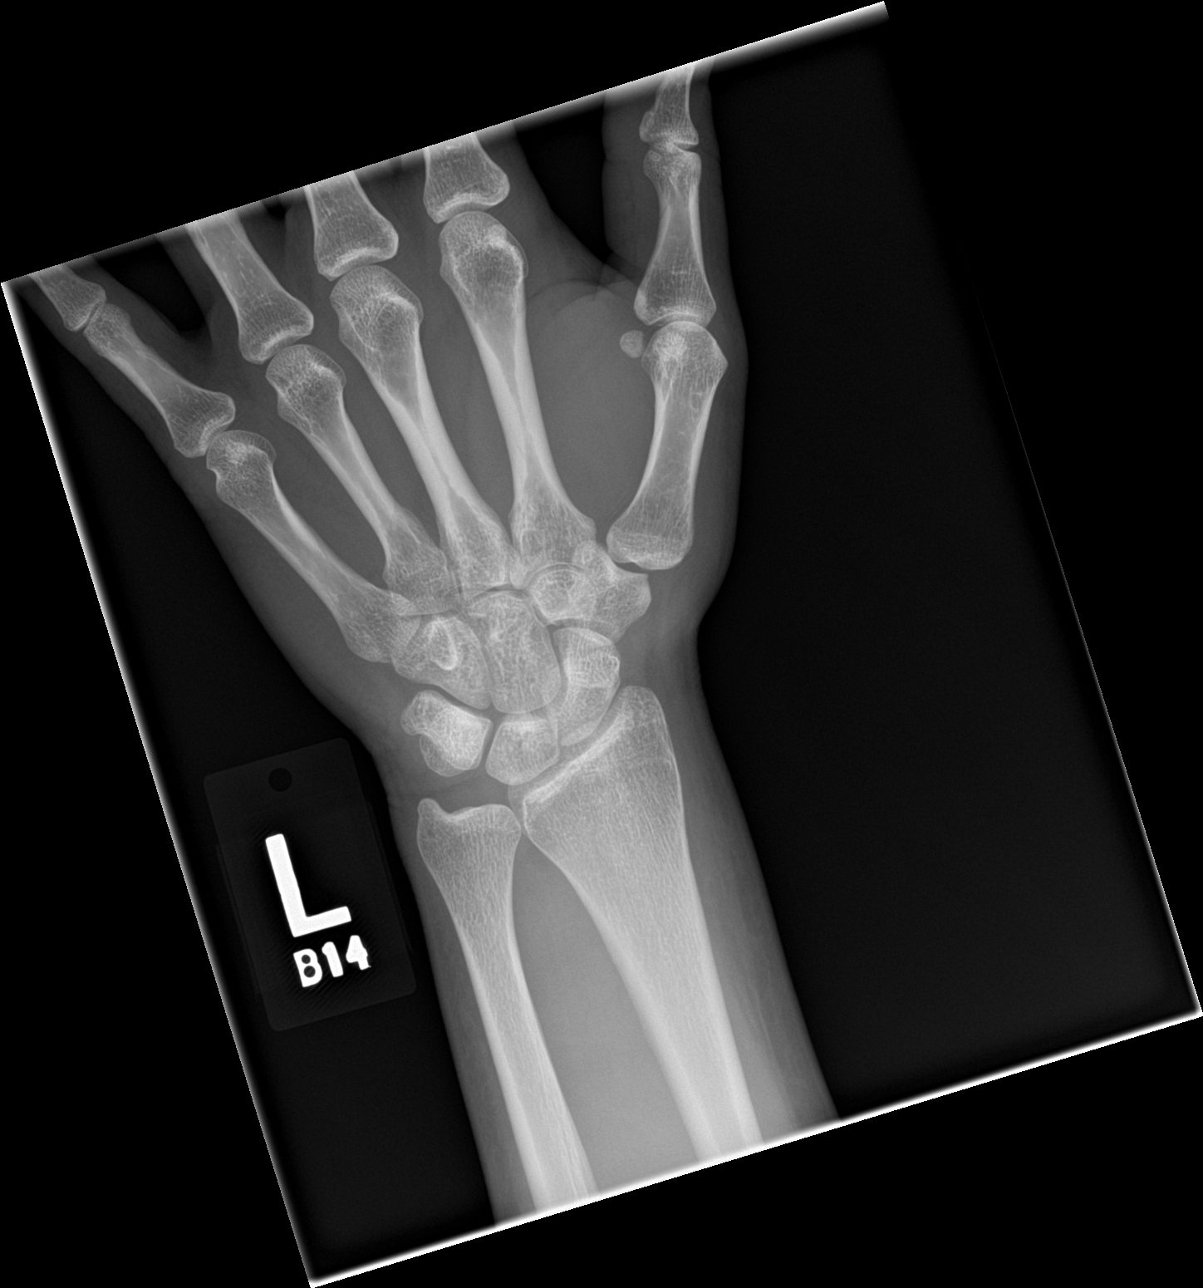

[wrist obl]
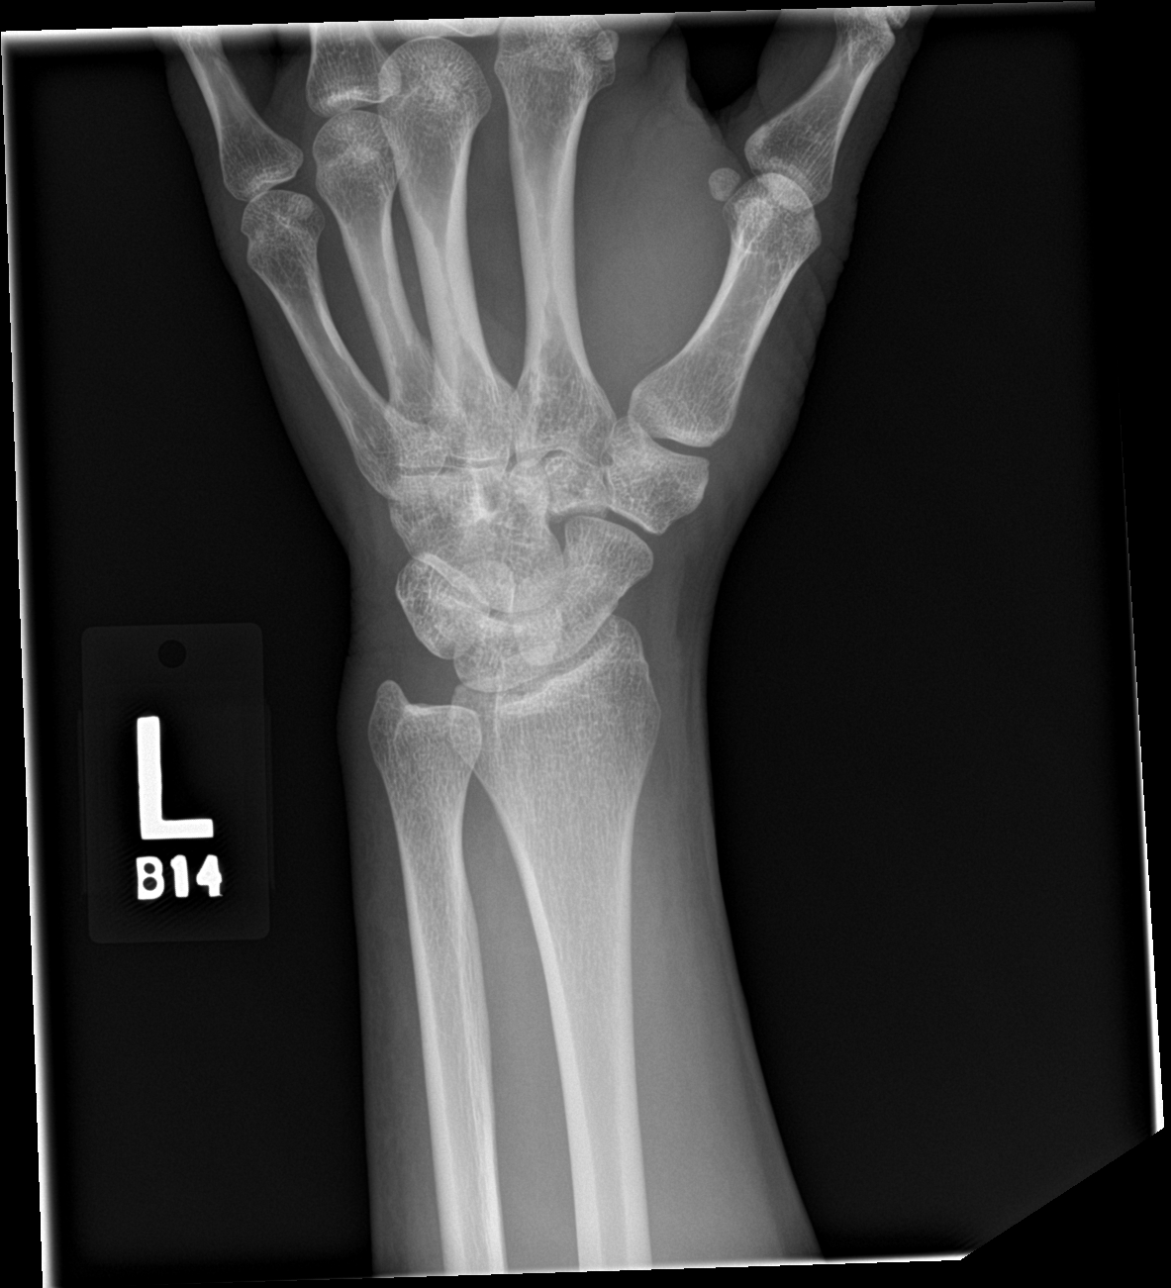

[wrist lat]
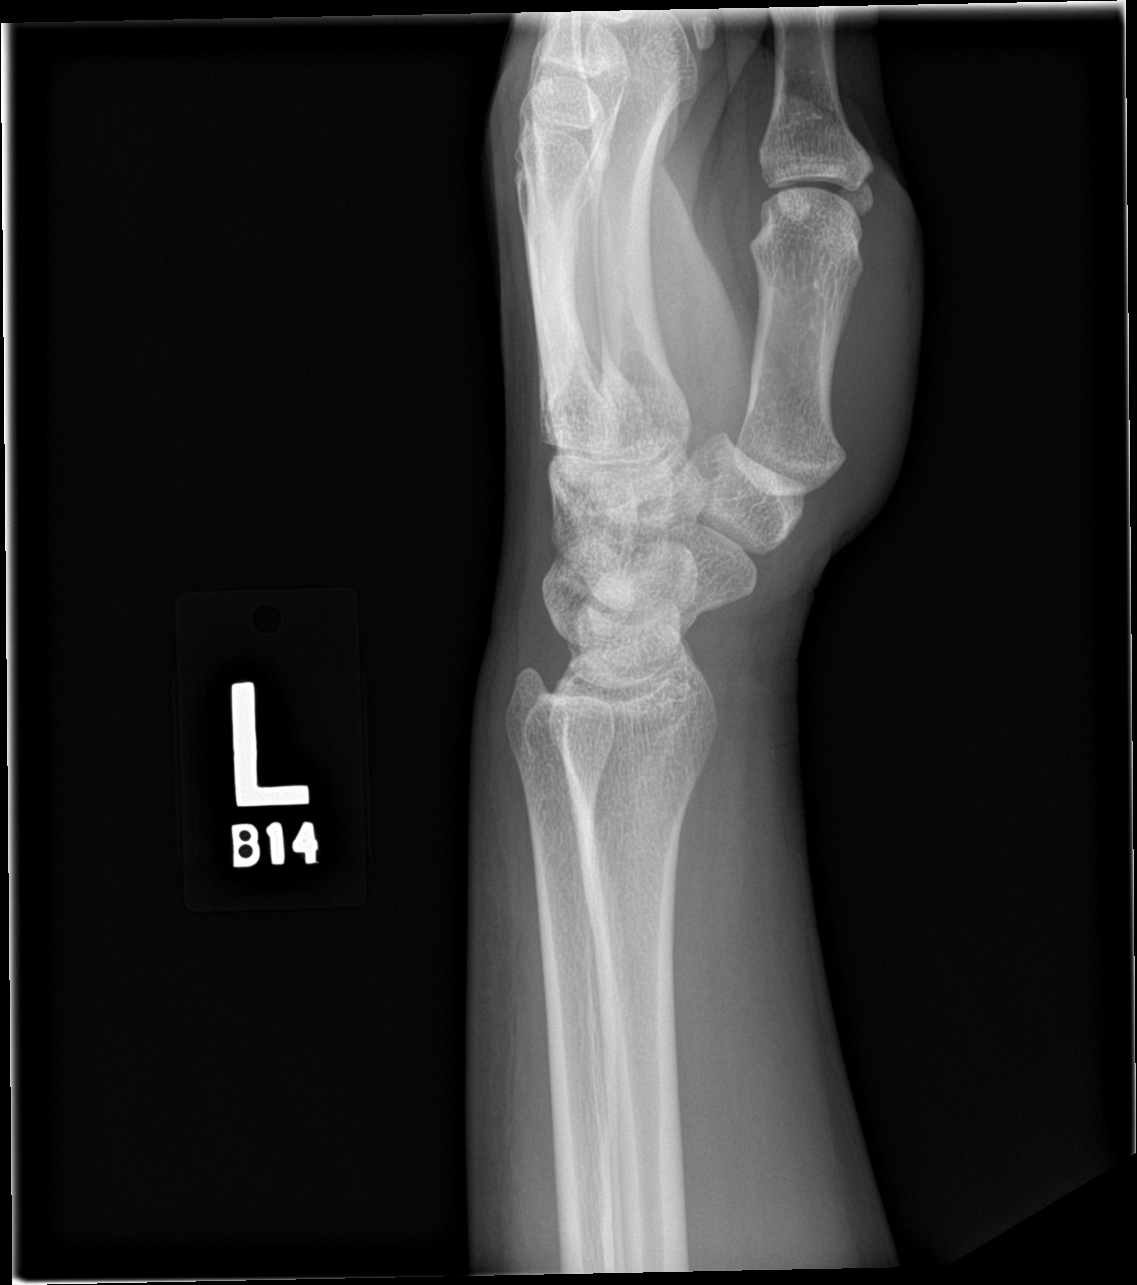

[wrist ap (2 of 2)]
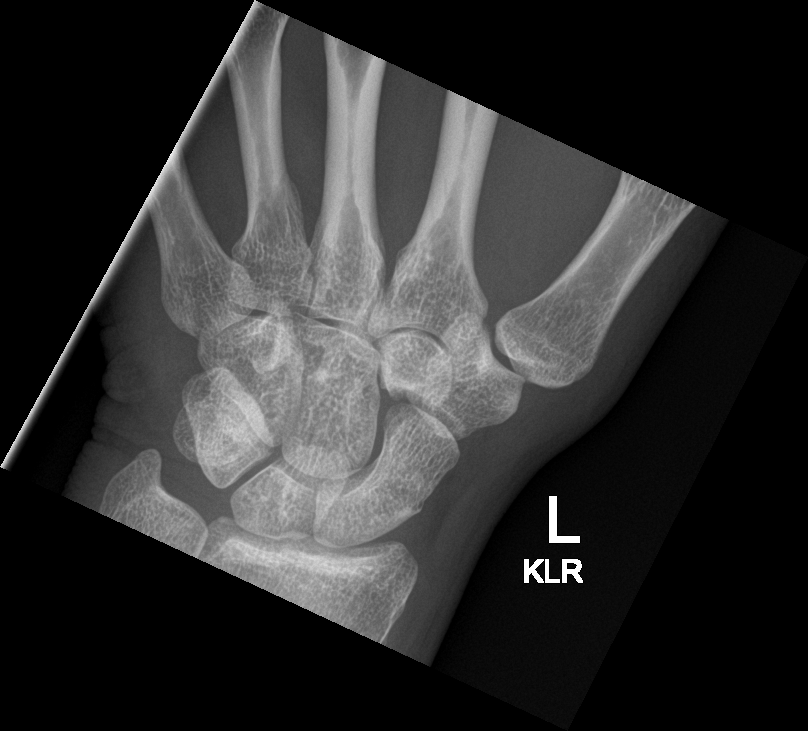

[4 of 4 positions shown; findings below may reference images not displayed]

FINDINGS: The bones of the wrist are subjectively adequately mineralized.
There is no acute fracture nor dislocation. The joint spaces are
reasonably well-maintained. The soft tissues exhibit no acute
abnormalities.
IMPRESSION: There is no acute or significant chronic bony abnormality of the
left wrist.
